# Patient Record
Sex: Female | Born: 1990 | Race: Black or African American | Hispanic: No | Marital: Single | Smoking: Never smoker
Health system: Southern US, Community
[De-identification: ages and names within clinical notes are randomized; demographics above are authoritative.]

## PROBLEM LIST (undated history)

## (undated) ENCOUNTER — Inpatient Hospital Stay: Payer: Self-pay

## (undated) DIAGNOSIS — E7800 Pure hypercholesterolemia, unspecified: Secondary | ICD-10-CM

## (undated) DIAGNOSIS — R87629 Unspecified abnormal cytological findings in specimens from vagina: Secondary | ICD-10-CM

## (undated) DIAGNOSIS — A749 Chlamydial infection, unspecified: Secondary | ICD-10-CM

## (undated) DIAGNOSIS — M21619 Bunion of unspecified foot: Secondary | ICD-10-CM

## (undated) HISTORY — PX: FOOT SURGERY: SHX648

## (undated) HISTORY — DX: Pure hypercholesterolemia, unspecified: E78.00

---
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
## 2009-04-11 ENCOUNTER — Emergency Department: Payer: Self-pay | Admitting: Emergency Medicine

## 2012-02-21 ENCOUNTER — Emergency Department: Payer: Self-pay | Admitting: *Deleted

## 2012-02-21 LAB — COMPREHENSIVE METABOLIC PANEL
Albumin: 4.1 g/dL (ref 3.4–5.0)
Alkaline Phosphatase: 79 U/L (ref 50–136)
Anion Gap: 9 (ref 7–16)
BUN: 13 mg/dL (ref 7–18)
Bilirubin,Total: 0.5 mg/dL (ref 0.2–1.0)
Calcium, Total: 8.9 mg/dL (ref 8.5–10.1)
Chloride: 105 mmol/L (ref 98–107)
Co2: 27 mmol/L (ref 21–32)
Creatinine: 0.79 mg/dL (ref 0.60–1.30)
EGFR (African American): >60
EGFR (Non-African Amer.): >60
Glucose: 95 mg/dL (ref 65–99)
Osmolality: 281 (ref 275–301)
Potassium: 3.4 mmol/L — ABNORMAL LOW (ref 3.5–5.1)
SGOT(AST): 15 U/L (ref 15–37)
SGPT (ALT): 14 U/L
Sodium: 141 mmol/L (ref 136–145)
Total Protein: 7.8 g/dL (ref 6.4–8.2)

## 2012-02-21 LAB — CBC
HCT: 40.9 % (ref 35.0–47.0)
HGB: 13.6 g/dL (ref 12.0–16.0)
MCH: 30.9 pg (ref 26.0–34.0)
MCHC: 33.3 g/dL (ref 32.0–36.0)
MCV: 93 fL (ref 80–100)
Platelet: 222 10*3/uL (ref 150–440)
RBC: 4.42 10*6/uL (ref 3.80–5.20)
RDW: 13 % (ref 11.5–14.5)
WBC: 8.6 10*3/uL (ref 3.6–11.0)

## 2012-02-21 LAB — URINALYSIS, COMPLETE
Bacteria: NONE SEEN
Bilirubin,UR: NEGATIVE
Blood: NEGATIVE
Glucose,UR: NEGATIVE mg/dL (ref 0–75)
Leukocyte Esterase: NEGATIVE
Nitrite: NEGATIVE
Ph: 5 (ref 4.5–8.0)
Protein: NEGATIVE
RBC,UR: 1 /HPF (ref 0–5)
Specific Gravity: 1.032 (ref 1.003–1.030)
Squamous Epithelial: 3
WBC UR: 1 /HPF (ref 0–5)

## 2012-02-21 LAB — WET PREP, GENITAL

## 2012-02-21 LAB — HCG, QUANTITATIVE, PREGNANCY: Beta Hcg, Quant.: 1368 m[IU]/mL — ABNORMAL HIGH

## 2012-02-27 ENCOUNTER — Other Ambulatory Visit: Payer: Self-pay | Admitting: Obstetrics and Gynecology

## 2012-02-27 LAB — HCG, QUANTITATIVE, PREGNANCY: Beta Hcg, Quant.: 7925 m[IU]/mL — ABNORMAL HIGH

## 2012-04-26 ENCOUNTER — Emergency Department: Payer: Self-pay | Admitting: Emergency Medicine

## 2012-04-26 LAB — LIPASE, BLOOD: Lipase: 177 U/L (ref 73–393)

## 2012-04-26 LAB — CBC
HCT: 40.3 % (ref 35.0–47.0)
HGB: 14 g/dL (ref 12.0–16.0)
MCH: 31.7 pg (ref 26.0–34.0)
MCHC: 34.8 g/dL (ref 32.0–36.0)
MCV: 91 fL (ref 80–100)
Platelet: 212 10*3/uL (ref 150–440)
RBC: 4.43 10*6/uL (ref 3.80–5.20)
RDW: 13.1 % (ref 11.5–14.5)
WBC: 11.7 10*3/uL — ABNORMAL HIGH (ref 3.6–11.0)

## 2012-04-26 LAB — URINALYSIS, COMPLETE
Bilirubin,UR: NEGATIVE
Blood: NEGATIVE
Glucose,UR: NEGATIVE mg/dL (ref 0–75)
Ketone: NEGATIVE
Leukocyte Esterase: NEGATIVE
Nitrite: NEGATIVE
Ph: 5 (ref 4.5–8.0)
Protein: NEGATIVE
RBC,UR: 2 /HPF (ref 0–5)
Specific Gravity: 1.029 (ref 1.003–1.030)
Squamous Epithelial: 15
WBC UR: 4 /HPF (ref 0–5)

## 2012-04-26 LAB — COMPREHENSIVE METABOLIC PANEL
Albumin: 3.7 g/dL (ref 3.4–5.0)
Alkaline Phosphatase: 60 U/L (ref 50–136)
Anion Gap: 9 (ref 7–16)
BUN: 14 mg/dL (ref 7–18)
Bilirubin,Total: 0.3 mg/dL (ref 0.2–1.0)
Calcium, Total: 9.2 mg/dL (ref 8.5–10.1)
Chloride: 105 mmol/L (ref 98–107)
Co2: 25 mmol/L (ref 21–32)
Creatinine: 0.69 mg/dL (ref 0.60–1.30)
EGFR (African American): >60
EGFR (Non-African Amer.): >60
Glucose: 70 mg/dL (ref 65–99)
Osmolality: 276 (ref 275–301)
Potassium: 3.7 mmol/L (ref 3.5–5.1)
SGOT(AST): 17 U/L (ref 15–37)
SGPT (ALT): 20 U/L (ref 12–78)
Sodium: 139 mmol/L (ref 136–145)
Total Protein: 7.9 g/dL (ref 6.4–8.2)

## 2012-04-26 LAB — HCG, QUANTITATIVE, PREGNANCY: Beta Hcg, Quant.: 97249 m[IU]/mL — ABNORMAL HIGH

## 2012-05-01 ENCOUNTER — Encounter: Payer: Self-pay | Admitting: Obstetrics and Gynecology

## 2012-05-12 ENCOUNTER — Encounter: Payer: Self-pay | Admitting: Obstetrics and Gynecology

## 2012-05-22 ENCOUNTER — Encounter: Payer: Self-pay | Admitting: Maternal and Fetal Medicine

## 2012-06-24 ENCOUNTER — Encounter: Payer: Self-pay | Admitting: Pediatric Cardiology

## 2012-07-17 ENCOUNTER — Encounter: Payer: Self-pay | Admitting: Obstetrics & Gynecology

## 2012-08-21 ENCOUNTER — Encounter: Payer: Self-pay | Admitting: Maternal & Fetal Medicine

## 2012-10-19 ENCOUNTER — Inpatient Hospital Stay: Payer: Self-pay | Admitting: Obstetrics and Gynecology

## 2012-10-19 LAB — CBC WITH DIFFERENTIAL/PLATELET
Basophil #: 0 10*3/uL (ref 0.0–0.1)
Basophil %: 0.2 %
Eosinophil #: 0 10*3/uL (ref 0.0–0.7)
Eosinophil %: 0.4 %
HCT: 37.5 % (ref 35.0–47.0)
HGB: 12.8 g/dL (ref 12.0–16.0)
Lymphocyte #: 1.1 10*3/uL (ref 1.0–3.6)
Lymphocyte %: 10.2 %
MCH: 30.7 pg (ref 26.0–34.0)
MCHC: 34.1 g/dL (ref 32.0–36.0)
MCV: 90 fL (ref 80–100)
Monocyte #: 1.1 x10 3/mm — ABNORMAL HIGH (ref 0.2–0.9)
Monocyte %: 9.9 %
Neutrophil #: 8.7 10*3/uL — ABNORMAL HIGH (ref 1.4–6.5)
Neutrophil %: 79.3 %
Platelet: 181 10*3/uL (ref 150–440)
RBC: 4.16 10*6/uL (ref 3.80–5.20)
RDW: 13.6 % (ref 11.5–14.5)
WBC: 11 10*3/uL (ref 3.6–11.0)

## 2012-10-21 LAB — HEMATOCRIT: HCT: 34.2 % — ABNORMAL LOW (ref 35.0–47.0)

## 2013-02-27 ENCOUNTER — Emergency Department: Payer: Self-pay | Admitting: Emergency Medicine

## 2013-07-09 IMAGING — US US OB >= 14 WKS - NRPT
1 series · 14 of 28 positions shown · non-contrast
Comparison: none

[Series 1: us ob >= 14 wks - nrpt · 0.26mm/px · 14 of 70 slices shown]
[im 3/70]
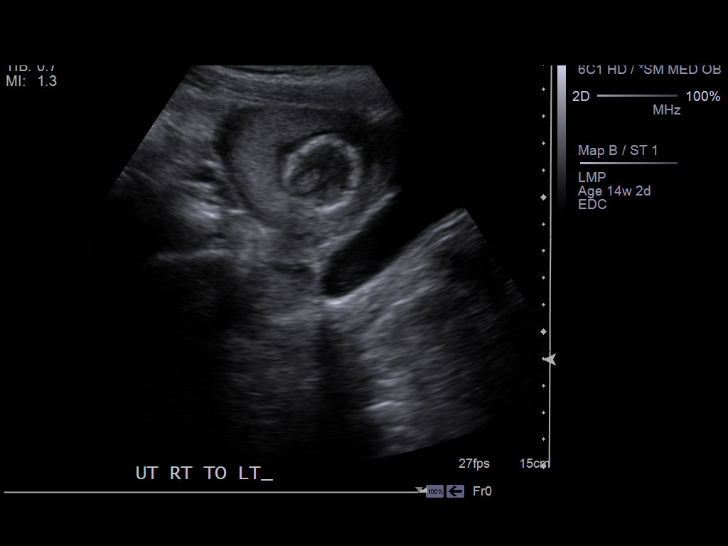
[im 8/70]
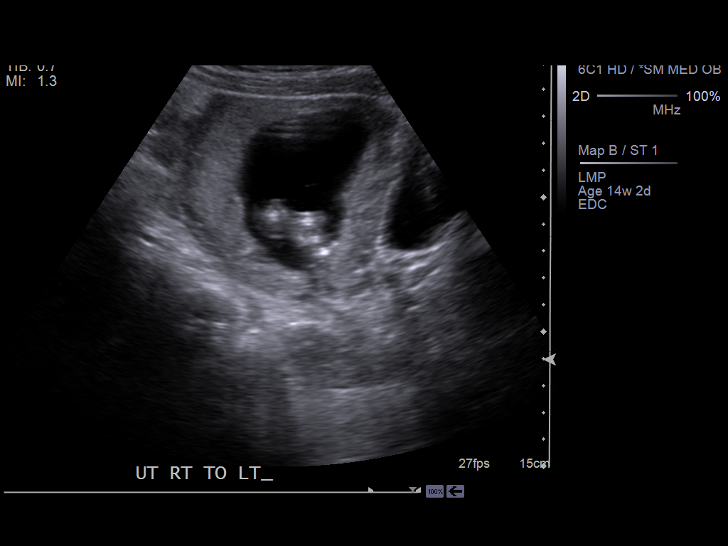
[im 13/70]
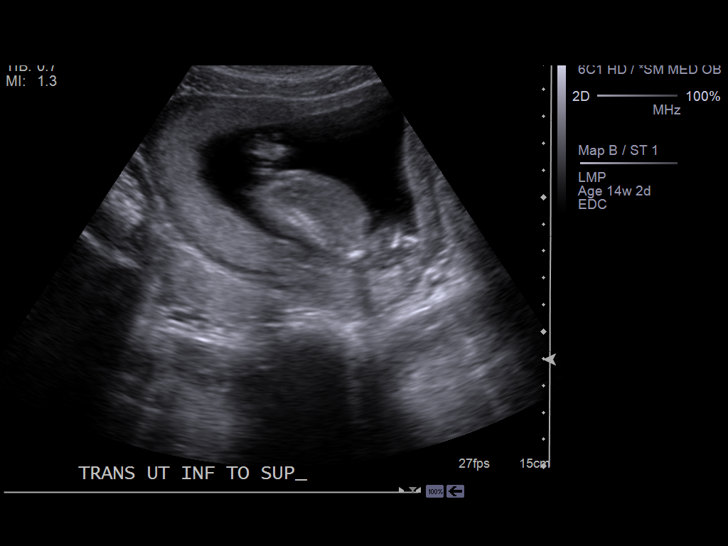
[im 18/70]
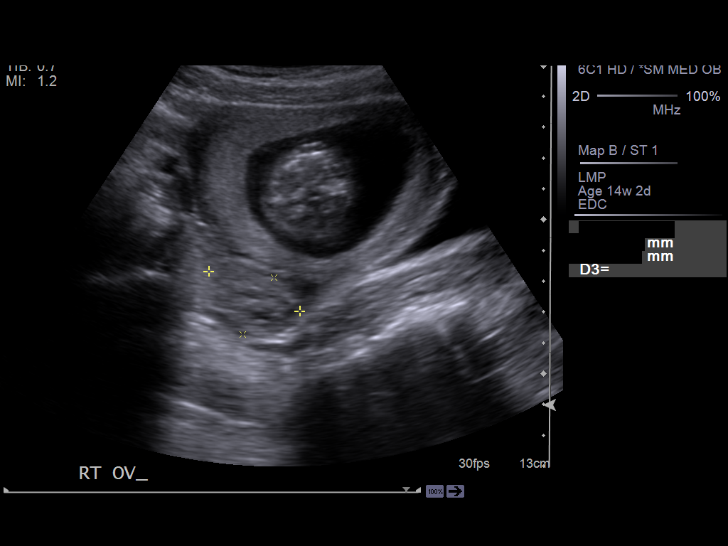
[im 24/70]
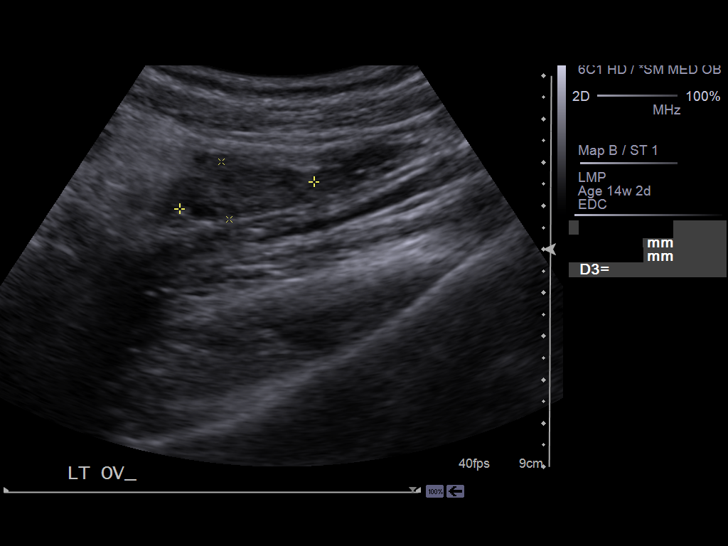
[im 29/70]
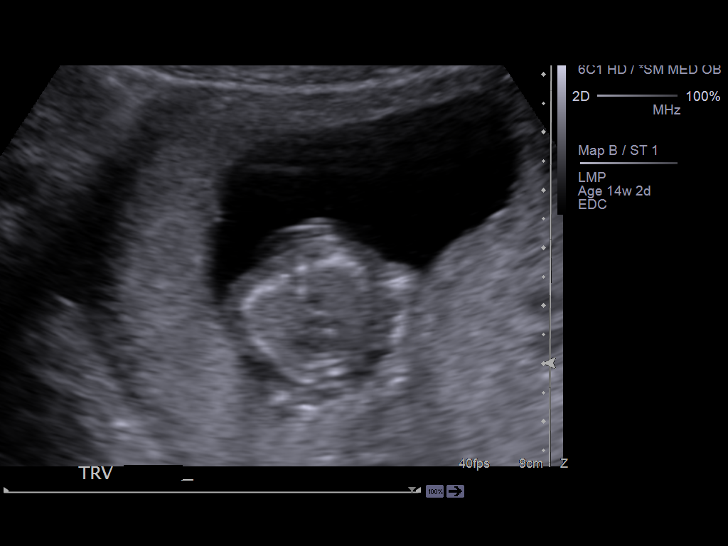
[im 34/70]
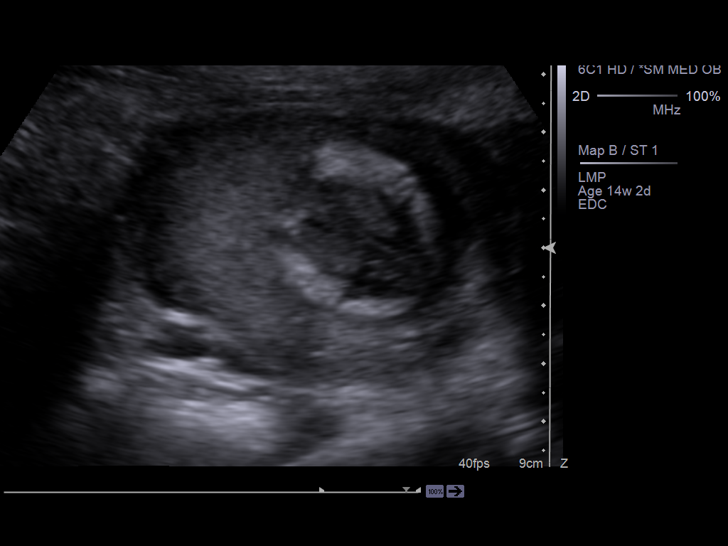
[im 39/70]
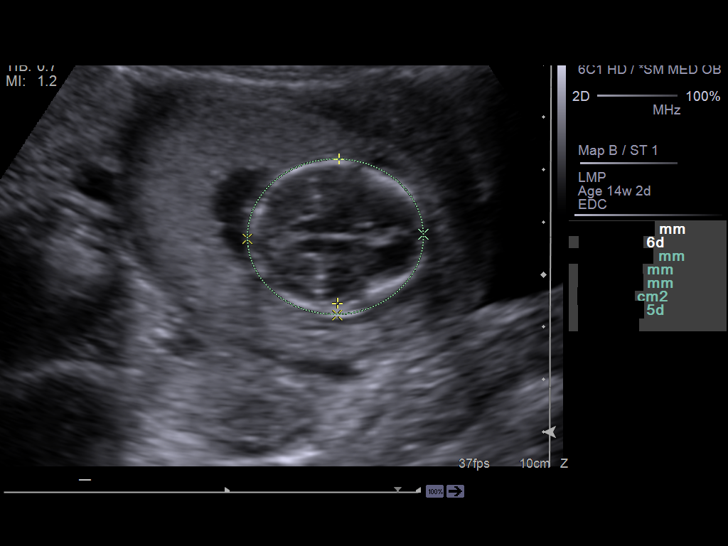
[im 44/70]
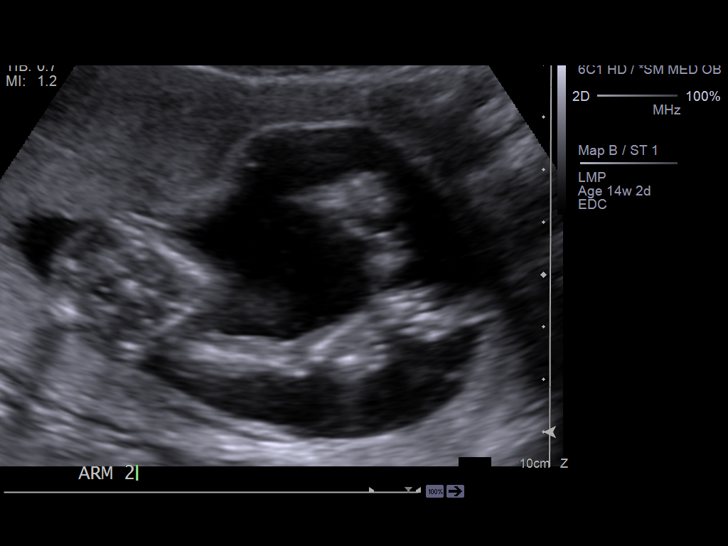
[im 49/70]
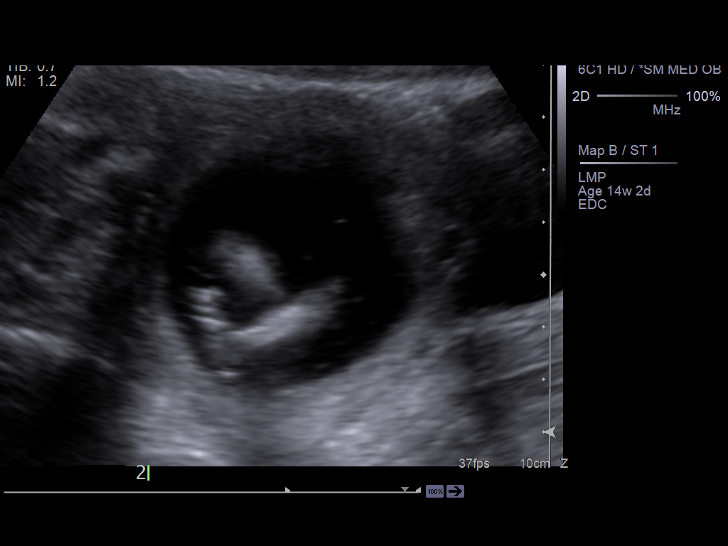
[im 54/70]
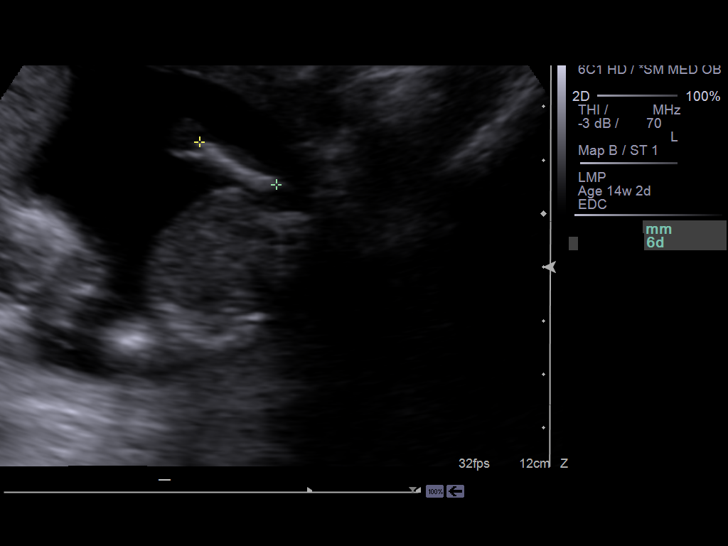
[im 59/70]
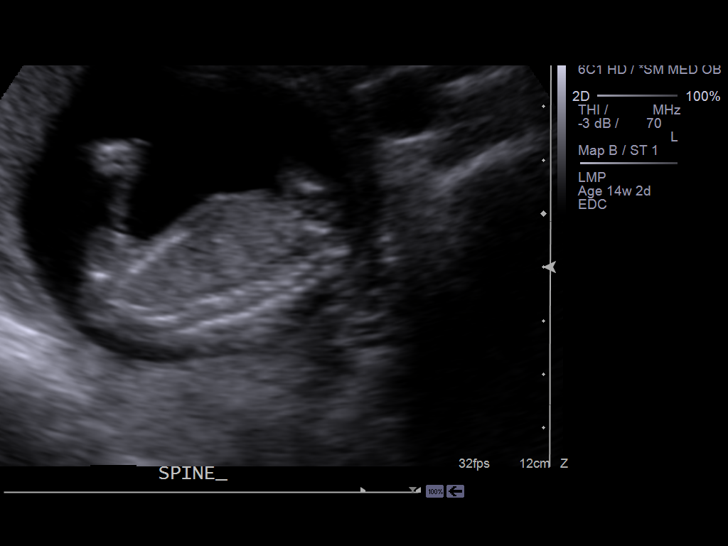
[im 64/70]
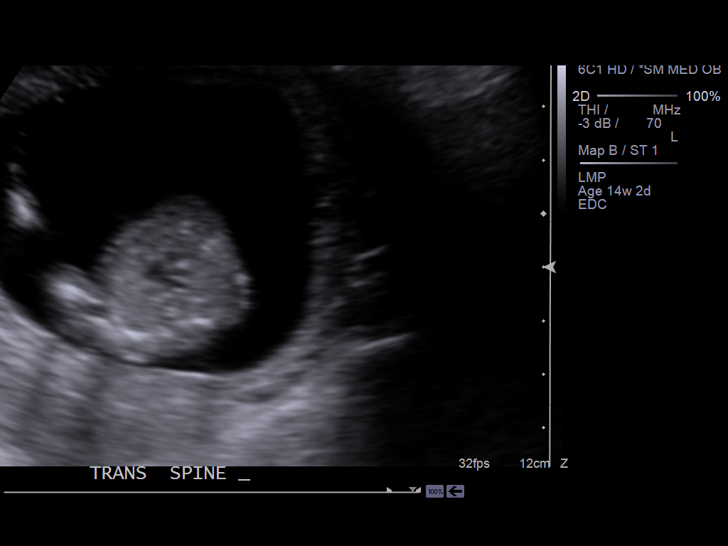
[im 70/70]
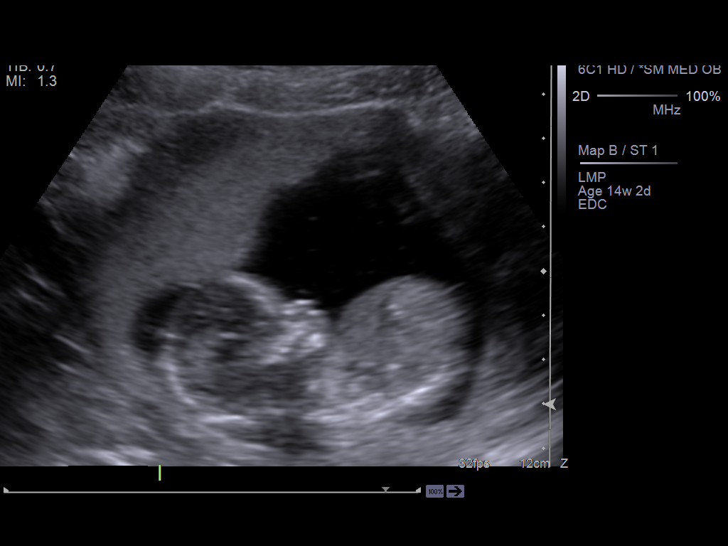

[14 of 28 positions shown; findings below may reference images not displayed]

IMAGES IMPORTED FROM THE [employer] WORKFLOW SYSTEM
NO DICTATION FOR STUDY

## 2013-09-21 ENCOUNTER — Emergency Department: Payer: Self-pay | Admitting: Emergency Medicine

## 2013-12-17 IMAGING — US US OB FOLLOW-UP - NRPT [facility]
1 series · 14 of 28 positions shown · non-contrast
Comparison: none

[Series 1: us ob follow-up - nrpt [facility] · 0.28mm/px · 14 of 33 slices shown]
[im 2/33]
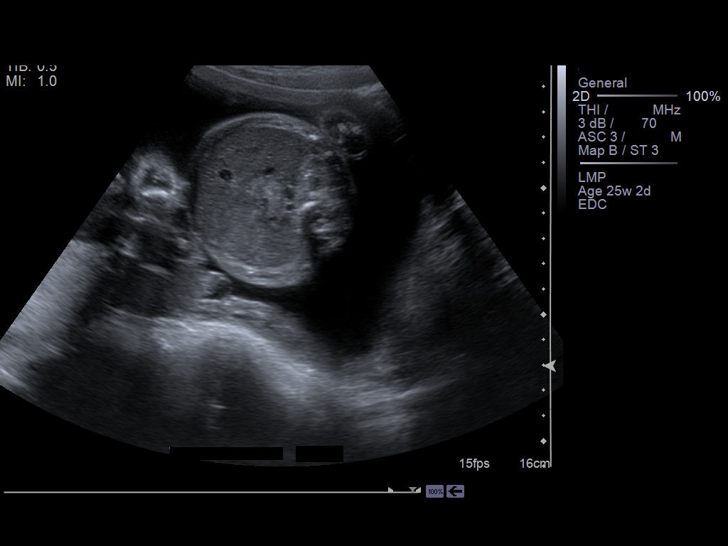
[im 4/33]
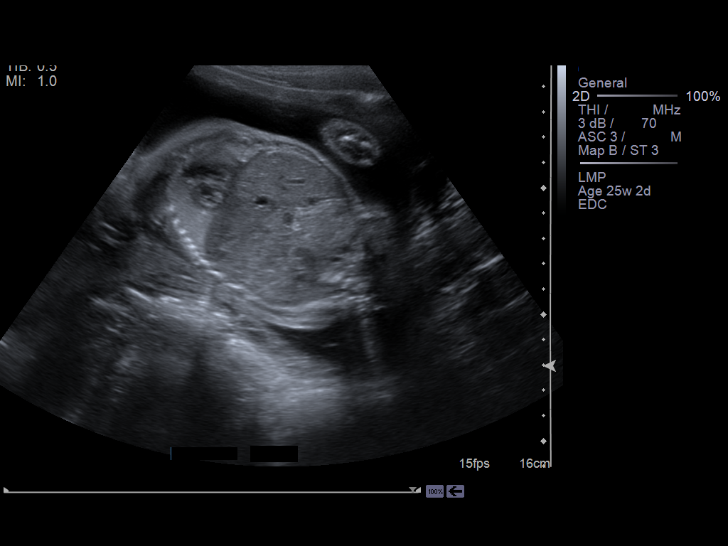
[im 6/33]
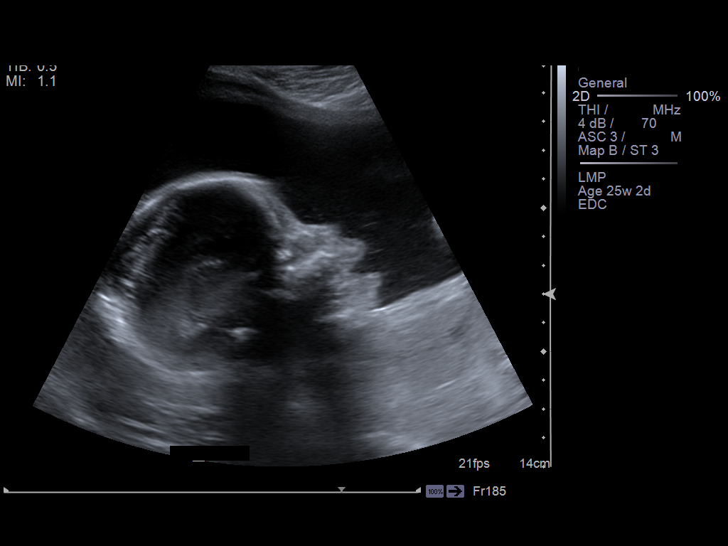
[im 9/33]
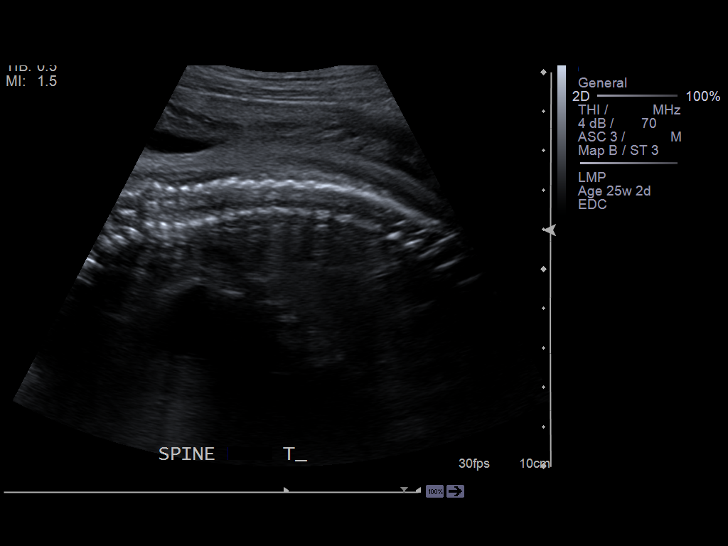
[im 11/33]
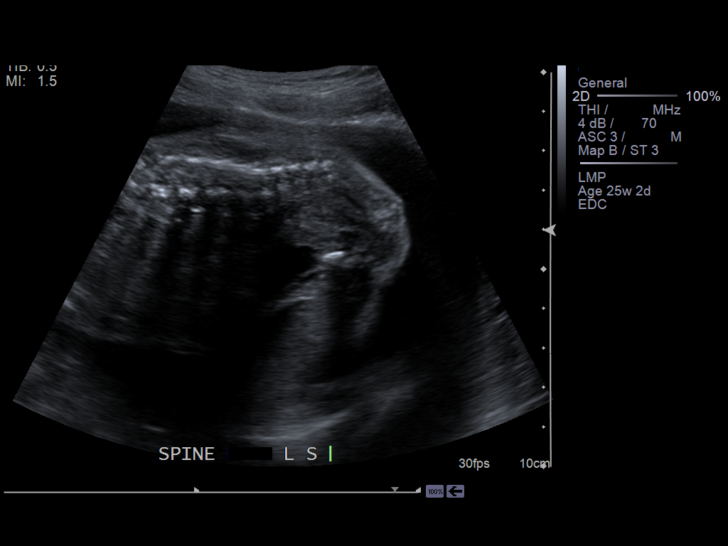
[im 14/33]
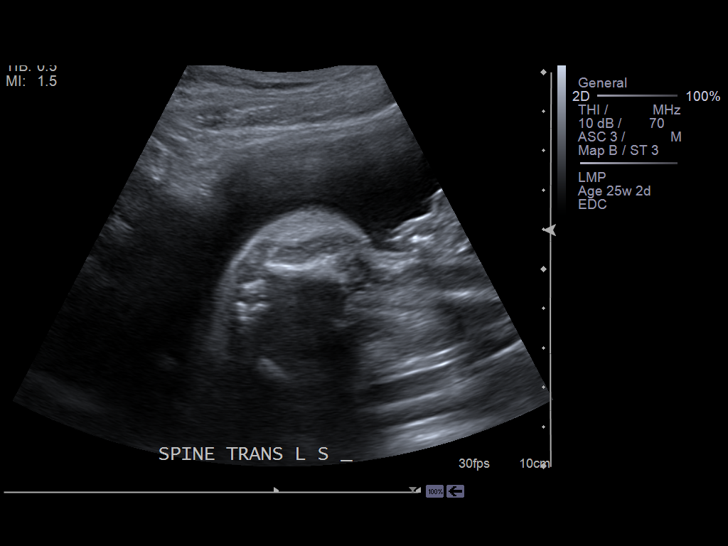
[im 16/33]
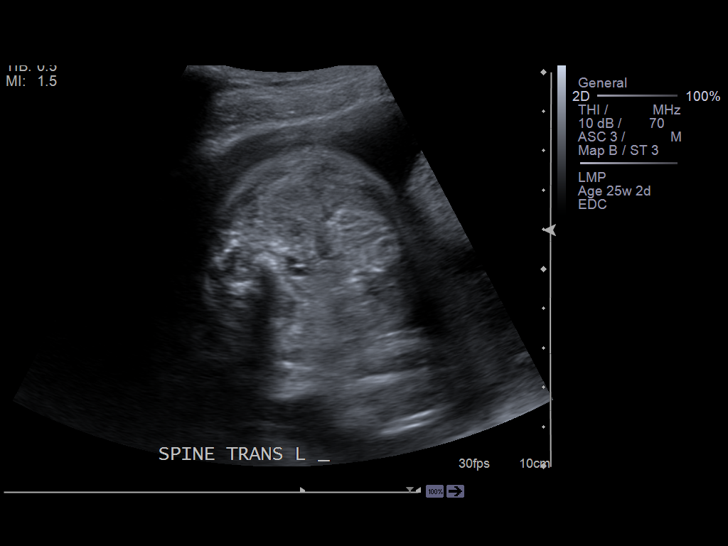
[im 18/33]
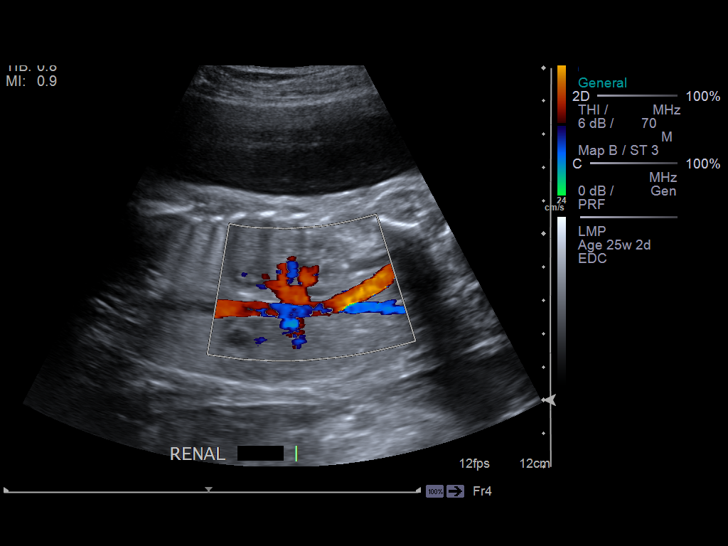
[im 21/33]
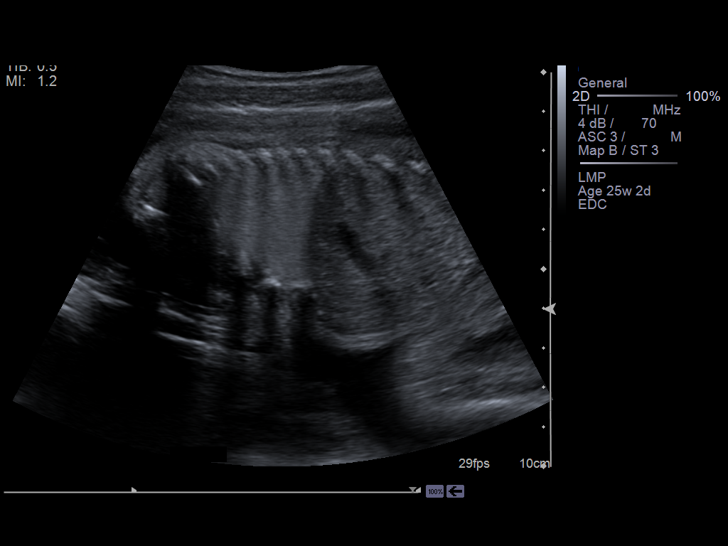
[im 23/33]
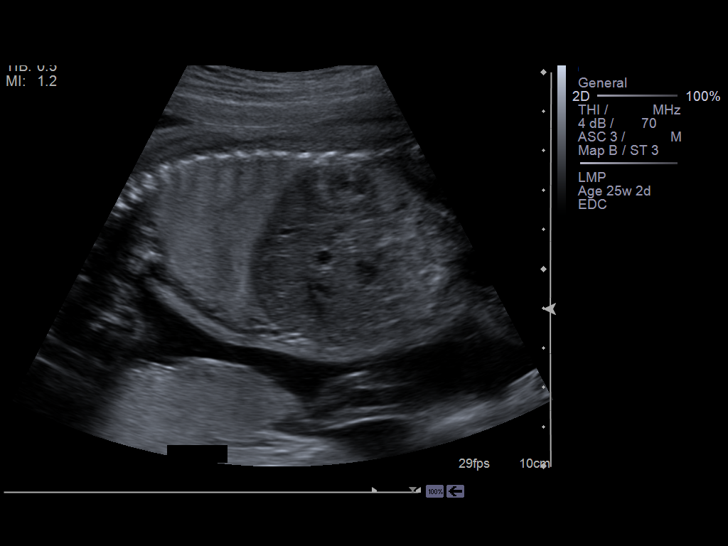
[im 25/33]
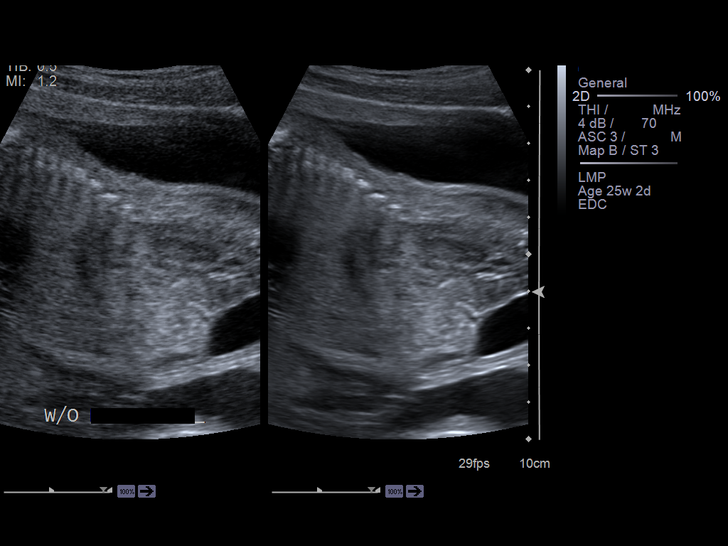
[im 28/33]
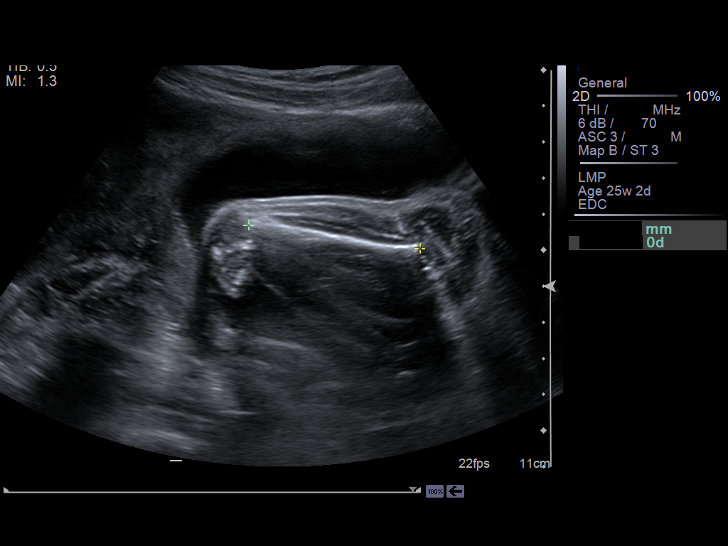
[im 30/33]
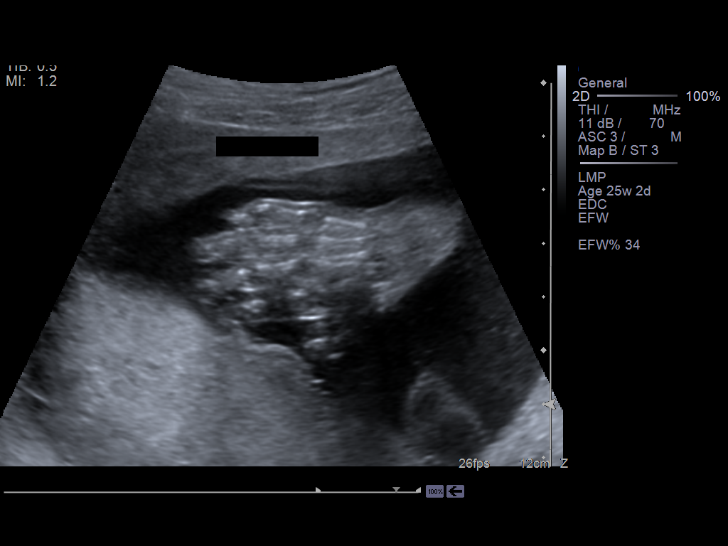
[im 33/33]
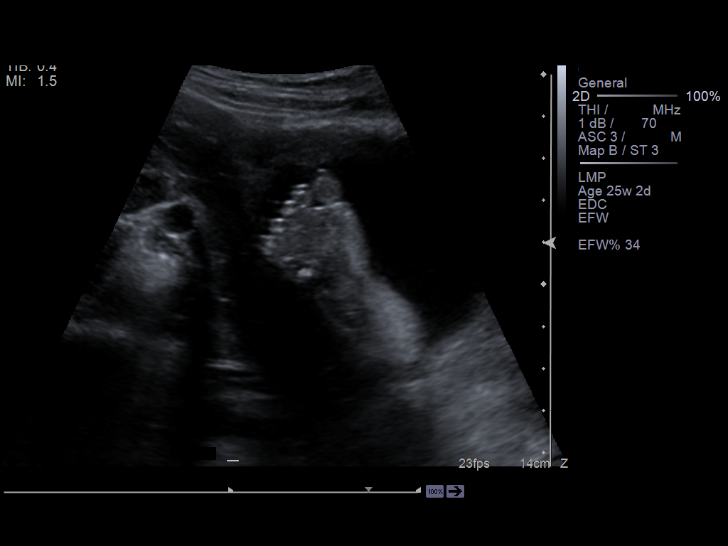

[14 of 28 positions shown; findings below may reference images not displayed]

IMAGES IMPORTED FROM THE SYNGO WORKFLOW SYSTEM
NO DICTATION FOR STUDY

## 2014-05-29 ENCOUNTER — Emergency Department: Payer: Self-pay | Admitting: Emergency Medicine

## 2014-05-29 LAB — COMPREHENSIVE METABOLIC PANEL
Albumin: 3.9 g/dL (ref 3.4–5.0)
Alkaline Phosphatase: 75 U/L
Anion Gap: 9 (ref 7–16)
BUN: 14 mg/dL (ref 7–18)
Bilirubin,Total: 0.3 mg/dL (ref 0.2–1.0)
Calcium, Total: 8.8 mg/dL (ref 8.5–10.1)
Chloride: 103 mmol/L (ref 98–107)
Co2: 28 mmol/L (ref 21–32)
Creatinine: 0.73 mg/dL (ref 0.60–1.30)
EGFR (African American): >60
EGFR (Non-African Amer.): >60
Glucose: 92 mg/dL (ref 65–99)
Osmolality: 280 (ref 275–301)
Potassium: 3.4 mmol/L — ABNORMAL LOW (ref 3.5–5.1)
SGOT(AST): 17 U/L (ref 15–37)
SGPT (ALT): 12 U/L — ABNORMAL LOW
Sodium: 140 mmol/L (ref 136–145)
Total Protein: 8 g/dL (ref 6.4–8.2)

## 2014-05-29 LAB — URINALYSIS, COMPLETE
Bilirubin,UR: NEGATIVE
Blood: NEGATIVE
Glucose,UR: NEGATIVE mg/dL (ref 0–75)
Ketone: NEGATIVE
Nitrite: NEGATIVE
Ph: 5 (ref 4.5–8.0)
Protein: NEGATIVE
RBC,UR: 1 /HPF (ref 0–5)
Specific Gravity: 1.028 (ref 1.003–1.030)
Squamous Epithelial: 2
WBC UR: 6 /HPF (ref 0–5)

## 2014-05-29 LAB — CBC WITH DIFFERENTIAL/PLATELET
Basophil #: 0 10*3/uL (ref 0.0–0.1)
Basophil %: 0.1 %
Eosinophil #: 0 10*3/uL (ref 0.0–0.7)
Eosinophil %: 0.2 %
HCT: 39.9 % (ref 35.0–47.0)
HGB: 13.4 g/dL (ref 12.0–16.0)
Lymphocyte #: 1.8 10*3/uL (ref 1.0–3.6)
Lymphocyte %: 17 %
MCH: 30.3 pg (ref 26.0–34.0)
MCHC: 33.7 g/dL (ref 32.0–36.0)
MCV: 90 fL (ref 80–100)
Monocyte #: 0.9 x10 3/mm (ref 0.2–0.9)
Monocyte %: 8.6 %
Neutrophil #: 7.9 10*3/uL — ABNORMAL HIGH (ref 1.4–6.5)
Neutrophil %: 74.1 %
Platelet: 227 10*3/uL (ref 150–440)
RBC: 4.43 10*6/uL (ref 3.80–5.20)
RDW: 13.2 % (ref 11.5–14.5)
WBC: 10.6 10*3/uL (ref 3.6–11.0)

## 2014-05-30 LAB — HCG, QUANTITATIVE, PREGNANCY: Beta Hcg, Quant.: 110205 m[IU]/mL — ABNORMAL HIGH

## 2014-05-30 LAB — WET PREP, GENITAL

## 2014-05-30 LAB — GC/CHLAMYDIA PROBE AMP

## 2014-06-30 LAB — OB RESULTS CONSOLE ABO/RH: RH Type: POSITIVE

## 2014-06-30 LAB — OB RESULTS CONSOLE RPR: RPR: NONREACTIVE

## 2014-06-30 LAB — OB RESULTS CONSOLE HIV ANTIBODY (ROUTINE TESTING): HIV: NONREACTIVE

## 2014-06-30 LAB — OB RESULTS CONSOLE HEPATITIS B SURFACE ANTIGEN: Hepatitis B Surface Ag: NEGATIVE

## 2014-06-30 LAB — OB RESULTS CONSOLE ANTIBODY SCREEN: Antibody Screen: NEGATIVE

## 2014-06-30 LAB — OB RESULTS CONSOLE RUBELLA ANTIBODY, IGM: Rubella: IMMUNE

## 2014-06-30 LAB — OB RESULTS CONSOLE VARICELLA ZOSTER ANTIBODY, IGG: Varicella: NON-IMMUNE/NOT IMMUNE

## 2014-07-05 ENCOUNTER — Encounter: Payer: Self-pay | Admitting: Maternal & Fetal Medicine

## 2014-07-19 ENCOUNTER — Emergency Department: Payer: Self-pay | Admitting: Emergency Medicine

## 2014-07-19 LAB — BASIC METABOLIC PANEL
Anion Gap: 9 (ref 7–16)
BUN: 13 mg/dL (ref 7–18)
Calcium, Total: 9 mg/dL (ref 8.5–10.1)
Chloride: 102 mmol/L (ref 98–107)
Co2: 25 mmol/L (ref 21–32)
Creatinine: 0.64 mg/dL (ref 0.60–1.30)
EGFR (African American): >60
EGFR (Non-African Amer.): >60
Glucose: 75 mg/dL (ref 65–99)
Osmolality: 271 (ref 275–301)
Potassium: 3.6 mmol/L (ref 3.5–5.1)
Sodium: 136 mmol/L (ref 136–145)

## 2014-07-19 LAB — URINALYSIS, COMPLETE
Bilirubin,UR: NEGATIVE
Blood: NEGATIVE
Glucose,UR: NEGATIVE mg/dL (ref 0–75)
Nitrite: NEGATIVE
Ph: 5 (ref 4.5–8.0)
Protein: NEGATIVE
RBC,UR: 2 /HPF (ref 0–5)
Specific Gravity: 1.025 (ref 1.003–1.030)
Squamous Epithelial: 3
WBC UR: 3 /HPF (ref 0–5)

## 2014-07-19 LAB — CBC WITH DIFFERENTIAL/PLATELET
Basophil #: 0.1 10*3/uL (ref 0.0–0.1)
Basophil %: 0.5 %
Eosinophil #: 0 10*3/uL (ref 0.0–0.7)
Eosinophil %: 0.2 %
HCT: 37.9 % (ref 35.0–47.0)
HGB: 12.5 g/dL (ref 12.0–16.0)
Lymphocyte #: 1.3 10*3/uL (ref 1.0–3.6)
Lymphocyte %: 9.6 %
MCH: 30.2 pg (ref 26.0–34.0)
MCHC: 33 g/dL (ref 32.0–36.0)
MCV: 91 fL (ref 80–100)
Monocyte #: 1 x10 3/mm — ABNORMAL HIGH (ref 0.2–0.9)
Monocyte %: 7.2 %
Neutrophil #: 11 10*3/uL — ABNORMAL HIGH (ref 1.4–6.5)
Neutrophil %: 82.5 %
Platelet: 204 10*3/uL (ref 150–440)
RBC: 4.15 10*6/uL (ref 3.80–5.20)
RDW: 13.5 % (ref 11.5–14.5)
WBC: 13.3 10*3/uL — ABNORMAL HIGH (ref 3.6–11.0)

## 2014-07-26 ENCOUNTER — Observation Stay: Payer: Self-pay | Admitting: Obstetrics and Gynecology

## 2014-07-26 LAB — MAGNESIUM: Magnesium: 1.7 mg/dL — ABNORMAL LOW

## 2014-07-27 LAB — COMPREHENSIVE METABOLIC PANEL
Albumin: 3 g/dL — ABNORMAL LOW (ref 3.4–5.0)
Alkaline Phosphatase: 59 U/L
Anion Gap: 8 (ref 7–16)
BUN: 8 mg/dL (ref 7–18)
Bilirubin,Total: 0.2 mg/dL (ref 0.2–1.0)
Calcium, Total: 8.9 mg/dL (ref 8.5–10.1)
Chloride: 104 mmol/L (ref 98–107)
Co2: 25 mmol/L (ref 21–32)
Creatinine: 0.69 mg/dL (ref 0.60–1.30)
EGFR (African American): >60
EGFR (Non-African Amer.): >60
Glucose: 84 mg/dL (ref 65–99)
Osmolality: 271 (ref 275–301)
Potassium: 3.8 mmol/L (ref 3.5–5.1)
SGOT(AST): 6 U/L — ABNORMAL LOW (ref 15–37)
SGPT (ALT): 12 U/L — ABNORMAL LOW
Sodium: 137 mmol/L (ref 136–145)
Total Protein: 6.9 g/dL (ref 6.4–8.2)

## 2014-07-27 LAB — CBC WITH DIFFERENTIAL/PLATELET
Basophil #: 0 10*3/uL (ref 0.0–0.1)
Basophil %: 0.3 %
Eosinophil #: 0.1 10*3/uL (ref 0.0–0.7)
Eosinophil %: 0.6 %
HCT: 35.9 % (ref 35.0–47.0)
HGB: 12.1 g/dL (ref 12.0–16.0)
Lymphocyte #: 1.9 10*3/uL (ref 1.0–3.6)
Lymphocyte %: 16.3 %
MCH: 30.8 pg (ref 26.0–34.0)
MCHC: 33.7 g/dL (ref 32.0–36.0)
MCV: 91 fL (ref 80–100)
Monocyte #: 1.1 x10 3/mm — ABNORMAL HIGH (ref 0.2–0.9)
Monocyte %: 9.3 %
Neutrophil #: 8.5 10*3/uL — ABNORMAL HIGH (ref 1.4–6.5)
Neutrophil %: 73.5 %
Platelet: 200 10*3/uL (ref 150–440)
RBC: 3.94 10*6/uL (ref 3.80–5.20)
RDW: 13.7 % (ref 11.5–14.5)
WBC: 11.6 10*3/uL — ABNORMAL HIGH (ref 3.6–11.0)

## 2014-08-13 NOTE — L&D Delivery Note (Signed)
Delivery Note At 12:35 PM a viable female was delivered via Vaginal, Spontaneous Delivery (Presentation:vertex).  APGAR: 7, 8; weight 6 lb 6.8 oz (2914 g).   Placenta status: Intact, Spontaneous.  Cord: 3 vessels with the following complications: None.  Cord pH: N/A  Anesthesia: Epidural  Episiotomy: None Lacerations: Periurethral Suture Repair: N/A Est. Blood Loss (mL): 300  Quick second stage. Infant to maternal abdomen, cord cut by family member and infant to bedside warmer by nursery staff for assessment.    Baby's Name: undecided  Mom to postpartum.  Baby to Couplet care / Skin to Skin.  Traci Petersen, Sache Sane 01/06/2015, 1:04 PM

## 2014-10-06 ENCOUNTER — Emergency Department: Payer: Self-pay | Admitting: Emergency Medicine

## 2014-12-21 ENCOUNTER — Observation Stay
Admission: EM | Admit: 2014-12-21 | Discharge: 2014-12-21 | Disposition: A | Discharge status: Home / Self Care | Payer: No Typology Code available for payment source | Attending: Obstetrics and Gynecology | Admitting: Obstetrics and Gynecology

## 2014-12-21 ENCOUNTER — Encounter: Payer: Self-pay | Admitting: *Deleted

## 2014-12-21 DIAGNOSIS — O4292 Full-term premature rupture of membranes, unspecified as to length of time between rupture and onset of labor: Principal | ICD-10-CM | POA: Insufficient documentation

## 2014-12-21 DIAGNOSIS — Z3A36 36 weeks gestation of pregnancy: Secondary | ICD-10-CM | POA: Diagnosis not present

## 2014-12-21 NOTE — Discharge Summary (Addendum)
OB Triage Discharge Summary   24 y.o. G2P1001 @ 1543w6d presenting for EOL.  Cervix clo/long/high; pt was 1/10 in clinic today at 1630. EFM showed rNST with irregular UCs Labs pending: none RTC 1 week  Triage evaluation done remotely.   Cornelia Copaharlie Everly Rubalcava, Jr MD [facility]  Pager: [phone]

## 2014-12-21 NOTE — Progress Notes (Signed)
Patient d/c ambulatory to home.  Patient left before we were able to give her discharge instruction papers,  However, I had verbally gone over s/s of labor.  S/s fetal wellbeing.  Stay hydrated and things to look for after having a SVE.  Patient verbalizes u/o same.

## 2014-12-21 NOTE — OB Triage Note (Signed)
Patient reports that contractions started at 0200 today. Contracting "on & off throughout the day. Now stronger. Unsure if AROM, states that she thought she was leaking at last OB visit. EFM applied, urine in bathroom. Loyola MastKaren R Maris Bena, RN

## 2014-12-24 LAB — OB RESULTS CONSOLE GC/CHLAMYDIA
Chlamydia: NEGATIVE
Gonorrhea: NEGATIVE

## 2014-12-24 LAB — OB RESULTS CONSOLE GBS: GBS: NEGATIVE

## 2015-01-06 ENCOUNTER — Inpatient Hospital Stay: Payer: No Typology Code available for payment source | Admitting: Anesthesiology

## 2015-01-06 ENCOUNTER — Inpatient Hospital Stay
Admission: EM | Admit: 2015-01-06 | Discharge: 2015-01-08 | DRG: 775 | Disposition: A | Discharge status: Home / Self Care | Payer: No Typology Code available for payment source | Attending: Obstetrics and Gynecology | Admitting: Obstetrics and Gynecology

## 2015-01-06 DIAGNOSIS — O4292 Full-term premature rupture of membranes, unspecified as to length of time between rupture and onset of labor: Secondary | ICD-10-CM | POA: Diagnosis present

## 2015-01-06 DIAGNOSIS — Z3A39 39 weeks gestation of pregnancy: Secondary | ICD-10-CM | POA: Diagnosis present

## 2015-01-06 DIAGNOSIS — O4690 Antepartum hemorrhage, unspecified, unspecified trimester: Secondary | ICD-10-CM | POA: Diagnosis present

## 2015-01-06 HISTORY — DX: Unspecified abnormal cytological findings in specimens from vagina: R87.629

## 2015-01-06 HISTORY — DX: Bunion of unspecified foot: M21.619

## 2015-01-06 HISTORY — DX: Chlamydial infection, unspecified: A74.9

## 2015-01-06 LAB — CBC
HCT: 38.1 % (ref 35.0–47.0)
Hemoglobin: 12.6 g/dL (ref 12.0–16.0)
MCH: 29.6 pg (ref 26.0–34.0)
MCHC: 33 g/dL (ref 32.0–36.0)
MCV: 89.7 fL (ref 80.0–100.0)
Platelets: 179 10*3/uL (ref 150–440)
RBC: 4.25 MIL/uL (ref 3.80–5.20)
RDW: 13.8 % (ref 11.5–14.5)
WBC: 11.8 10*3/uL — ABNORMAL HIGH (ref 3.6–11.0)

## 2015-01-06 LAB — TYPE AND SCREEN
ABO/RH(D): B POS
Antibody Screen: NEGATIVE

## 2015-01-06 LAB — CHLAMYDIA/NGC RT PCR (ARMC ONLY)
Chlamydia Tr: NOT DETECTED
N gonorrhoeae: DETECTED

## 2015-01-06 LAB — ABO/RH: ABO/RH(D): B POS

## 2015-01-06 MED ORDER — MISOPROSTOL 200 MCG PO TABS
ORAL_TABLET | ORAL | Status: AC
  Filled 2015-01-06: qty 4

## 2015-01-06 MED ORDER — BUTORPHANOL TARTRATE 1 MG/ML IJ SOLN
1.0000 mg | INTRAMUSCULAR | Status: DC | PRN
  Administered 2015-01-06: 1 mg via INTRAVENOUS

## 2015-01-06 MED ORDER — ONDANSETRON HCL 4 MG PO TABS
4.0000 mg | ORAL_TABLET | ORAL | Status: DC | PRN
  Filled 2015-01-06: qty 1

## 2015-01-06 MED ORDER — FENTANYL 2.5 MCG/ML W/ROPIVACAINE 0.2% IN NS 100 ML EPIDURAL INFUSION (ARMC-ANES)
EPIDURAL | Status: AC
  Administered 2015-01-06: 9 mL/h via EPIDURAL
  Filled 2015-01-06: qty 100

## 2015-01-06 MED ORDER — OXYTOCIN BOLUS FROM INFUSION: 500.0000 mL | INTRAVENOUS | Status: DC

## 2015-01-06 MED ORDER — OXYTOCIN 10 UNIT/ML IJ SOLN
INTRAMUSCULAR | Status: AC
  Filled 2015-01-06: qty 2

## 2015-01-06 MED ORDER — LACTATED RINGERS IV SOLN: INTRAVENOUS | Status: DC

## 2015-01-06 MED ORDER — ONDANSETRON HCL 4 MG/2ML IJ SOLN: 4.0000 mg | Freq: Four times a day (QID) | INTRAMUSCULAR | Status: DC | PRN

## 2015-01-06 MED ORDER — BUPIVACAINE HCL (PF) 0.25 % IJ SOLN
INTRAMUSCULAR | Status: DC | PRN
  Administered 2015-01-06 (×2): 4 mL

## 2015-01-06 MED ORDER — LIDOCAINE HCL (PF) 1 % IJ SOLN
INTRAMUSCULAR | Status: AC
  Filled 2015-01-06: qty 30

## 2015-01-06 MED ORDER — DIPHENHYDRAMINE HCL 50 MG/ML IJ SOLN: 12.5000 mg | INTRAMUSCULAR | Status: DC | PRN

## 2015-01-06 MED ORDER — LACTATED RINGERS IV SOLN: 500.0000 mL | INTRAVENOUS | Status: DC | PRN

## 2015-01-06 MED ORDER — PRENATAL MULTIVITAMIN CH
1.0000 | ORAL_TABLET | Freq: Every day | ORAL | Status: DC
  Administered 2015-01-07 – 2015-01-08 (×2): 1 via ORAL
  Filled 2015-01-06 (×2): qty 1

## 2015-01-06 MED ORDER — BUTORPHANOL TARTRATE 1 MG/ML IJ SOLN
INTRAMUSCULAR | Status: AC
  Administered 2015-01-06: 1 mg via INTRAVENOUS
  Filled 2015-01-06: qty 1

## 2015-01-06 MED ORDER — DIPHENHYDRAMINE HCL 25 MG PO CAPS: 25.0000 mg | ORAL_CAPSULE | Freq: Four times a day (QID) | ORAL | Status: DC | PRN

## 2015-01-06 MED ORDER — DIBUCAINE 1 % RE OINT: 1.0000 "application " | TOPICAL_OINTMENT | RECTAL | Status: DC | PRN

## 2015-01-06 MED ORDER — WITCH HAZEL-GLYCERIN EX PADS: 1.0000 "application " | MEDICATED_PAD | CUTANEOUS | Status: DC | PRN

## 2015-01-06 MED ORDER — PRENATAL PLUS 27-1 MG PO TABS: 1.0000 | ORAL_TABLET | Freq: Every day | ORAL | Status: DC

## 2015-01-06 MED ORDER — IBUPROFEN 600 MG PO TABS
600.0000 mg | ORAL_TABLET | Freq: Four times a day (QID) | ORAL | Status: DC
  Administered 2015-01-06 – 2015-01-08 (×8): 600 mg via ORAL
  Filled 2015-01-06 (×8): qty 1

## 2015-01-06 MED ORDER — LACTATED RINGERS IV SOLN
INTRAVENOUS | Status: DC
  Administered 2015-01-06 (×2): via INTRAVENOUS

## 2015-01-06 MED ORDER — LIDOCAINE HCL (PF) 1 % IJ SOLN: 30.0000 mL | INTRAMUSCULAR | Status: DC | PRN

## 2015-01-06 MED ORDER — ZOLPIDEM TARTRATE 5 MG PO TABS: 5.0000 mg | ORAL_TABLET | Freq: Every evening | ORAL | Status: DC | PRN

## 2015-01-06 MED ORDER — LANOLIN HYDROUS EX OINT: TOPICAL_OINTMENT | CUTANEOUS | Status: DC | PRN

## 2015-01-06 MED ORDER — ACETAMINOPHEN 325 MG PO TABS: 650.0000 mg | ORAL_TABLET | ORAL | Status: DC | PRN

## 2015-01-06 MED ORDER — SIMETHICONE 80 MG PO CHEW
80.0000 mg | CHEWABLE_TABLET | ORAL | Status: DC | PRN
  Filled 2015-01-06: qty 1

## 2015-01-06 MED ORDER — FENTANYL 2.5 MCG/ML W/ROPIVACAINE 0.2% IN NS 100 ML EPIDURAL INFUSION (ARMC-ANES)
9.0000 mL/h | EPIDURAL | Status: DC
  Administered 2015-01-06 (×2): 9 mL/h via EPIDURAL

## 2015-01-06 MED ORDER — BENZOCAINE-MENTHOL 20-0.5 % EX AERO: 1.0000 "application " | INHALATION_SPRAY | CUTANEOUS | Status: DC | PRN

## 2015-01-06 MED ORDER — AMMONIA AROMATIC IN INHA
RESPIRATORY_TRACT | Status: AC
  Filled 2015-01-06: qty 10

## 2015-01-06 MED ORDER — LIDOCAINE-EPINEPHRINE (PF) 1.5 %-1:200000 IJ SOLN
INTRAMUSCULAR | Status: DC | PRN
  Administered 2015-01-06: 1.5 mL

## 2015-01-06 MED ORDER — HYDROCODONE-ACETAMINOPHEN 5-325 MG PO TABS
1.0000 | ORAL_TABLET | Freq: Four times a day (QID) | ORAL | Status: DC | PRN
Start: 2015-01-06 — End: 2015-01-08

## 2015-01-06 MED ORDER — OXYTOCIN 40 UNITS IN LACTATED RINGERS INFUSION - SIMPLE MED
INTRAVENOUS | Status: AC
  Filled 2015-01-06: qty 1000

## 2015-01-06 MED ORDER — EPHEDRINE 5 MG/ML INJ: 10.0000 mg | INTRAVENOUS | Status: DC | PRN

## 2015-01-06 MED ORDER — PHENYLEPHRINE 40 MCG/ML (10ML) SYRINGE FOR IV PUSH (FOR BLOOD PRESSURE SUPPORT): 80.0000 ug | PREFILLED_SYRINGE | INTRAVENOUS | Status: DC | PRN

## 2015-01-06 MED ORDER — OXYTOCIN 40 UNITS IN LACTATED RINGERS INFUSION - SIMPLE MED: 62.5000 mL/h | INTRAVENOUS | Status: DC

## 2015-01-06 MED ORDER — ONDANSETRON HCL 4 MG/2ML IJ SOLN: 4.0000 mg | INTRAMUSCULAR | Status: DC | PRN

## 2015-01-06 MED ORDER — FERROUS SULFATE 325 (65 FE) MG PO TABS
325.0000 mg | ORAL_TABLET | Freq: Every day | ORAL | Status: DC
  Administered 2015-01-07 – 2015-01-08 (×2): 325 mg via ORAL
  Filled 2015-01-06 (×2): qty 1

## 2015-01-06 MED ORDER — DOCUSATE SODIUM 100 MG PO CAPS: 100.0000 mg | ORAL_CAPSULE | Freq: Two times a day (BID) | ORAL | Status: DC | PRN

## 2015-01-06 NOTE — H&P (Signed)
Obstetric History and Physical  Traci Petersen is a 24 y.o. G2P1001 with IUP at 2125w1d based on a 7573w1d scan presenting for some blood discharge when she wipes. She reports that she woke up mat around 3 am and she noticed the bleeding when she went to the bathroom along with some contractions. She also reports ? SROM around 0530 am.  Patient states she has been having  regular, every 2-6 minutes contractions, minimal vaginal bleeding, possible membranes, with active fetal movement.    Prenatal Course Source of Care: [facility]  with onset of care at 12  weeks Pregnancy complications or risks:  GC infection during pregnancy- seen by [facility] with no f/u needed, CMV exposure with negative IgG antibody, and negative informaseq for a history of G1 with Down syndrome.  Patient Active Problem List   Diagnosis Date Noted  . Vaginal bleeding in pregnancy 01/06/2015  . Labor and delivery, indication for care 12/21/2014   She plans to breastfeed   Prenatal labs and studies: ABO, Rh: B/Positive/-- (11/18 0000) Antibody: Negative (11/18 0000) Rubella: Immune (11/18 0000) Varicella: non-immune RPR: Nonreactive (11/18 0000)  HBsAg: Negative (11/18 0000)  HIV: Non-reactive (11/18 0000)  WUJ:WJXBJYNWGBS:Negative (05/13 0000) 1 hr Glucola  116 Genetic screening normal Anatomy US normal Tdap: given  Flu: NA   Past Medical History  Diagnosis Date  . Vaginal Pap smear, abnormal   . Chlamydia   . Bunion     Past Surgical History  Procedure Laterality Date  . Foot surgery      OB History  Gravida Para Term Preterm AB SAB TAB Ectopic Multiple Living  2 1 1       1     # Outcome Date GA Lbr Len/2nd Weight Sex Delivery Anes PTL Lv  2 Current           1 Term 10/20/12   2.863 kg (6 lb 5 oz) M Vag-Spont   Y      History   Social History  . Marital Status: Single    Spouse Name: N/A  . Number of Children: N/A  . Years of Education: N/A   Social History Main Topics  . Smoking status: Never  Smoker   . Smokeless tobacco: Never Used  . Alcohol Use: No  . Drug Use: No  . Sexual Activity: Yes    Birth Control/ Protection: None   Other Topics Concern  . None   Social History Narrative    Family History  Problem Relation Age of Onset  . Breast cancer Paternal Grandmother     Prescriptions prior to admission  Medication Sig Dispense Refill Last Dose  . prenatal vitamin w/FE, FA (PRENATAL 1 + 1) 27-1 MG TABS tablet Take 1 tablet by mouth daily at 12 noon.   01/05/2015 at Unknown time    No Known Allergies  Review of Systems: Negative except for what is mentioned in HPI.  Physical Exam: BP 113/66 mmHg  Pulse 71  Temp(Src) 98.5 F (36.9 C) (Oral)  Resp 16  Ht 5\' 6"  (1.676 m)  Wt 77.111 kg (170 lb)  BMI 27.45 kg/m2 GENERAL: Well-developed, well-nourished female in no acute distress.  LUNGS: Clear to auscultation bilaterally.  HEART: Regular rate and rhythm. ABDOMEN: Soft, nontender, nondistended, gravid. EXTREMITIES: Nontender, no edema, 2+ distal pulses. Dilation: 3 Effacement (%): 80 Cervical Position: Anterior Station: -1 Presentation: Vertex Exam by:: JLW  Grossly ruptured confirmed with SSE- +pooling and +fern Presentation: cephalic FHT:  Baseline rate 135 bpm  Variability moderate  Accelerations present   Decelerations none Contractions: Every 4-6 mins  Pertinent Labs/Studies:   No results found for this or any previous visit (from the past 24 hour(s)).  Assessment : Traci Petersen is a 24 y.o. G2P1001 at [redacted]w[redacted]d being admitted for labor.  Plan: Labor: Expectant management.  Induction/Augmentation as needed, per protocol FWB: Reassuring fetal heart tracing.   GBS negative Delivery plan: Hopeful for vaginal delivery Plans breastfeeding   Jannet Mantis, CNM [facility]

## 2015-01-06 NOTE — Anesthesia Procedure Notes (Signed)
Epidural Patient location during procedure: OB Start time: 01/06/2015 11:31 AM End time: 01/06/2015 11:43 AM  Staffing Resident/CRNA: Stormy FabianURTIS, Nat Lowenthal  Preanesthetic Checklist Completed: patient identified, site marked, surgical consent, pre-op evaluation, timeout performed, IV checked, risks and benefits discussed and monitors and equipment checked  Epidural Patient position: sitting Prep: Betadine Patient monitoring: heart rate, continuous pulse ox and blood pressure Approach: midline Location: L4-L5 Injection technique: LOR saline  Needle:  Needle type: Tuohy  Needle gauge: 18 G Needle length: 9 cm and 9 Needle insertion depth: 6.5 cm Catheter type: closed end flexible Catheter size: 20 Guage Catheter at skin depth: 10 cm Test dose: negative and 1.5% lidocaine with Epi 1:200 K  Assessment Events: blood not aspirated, injection not painful, no injection resistance, negative IV test and no paresthesia  Additional Notes   Patient tolerated the insertion well without complications.Reason for block:procedure for pain

## 2015-01-06 NOTE — Progress Notes (Signed)
L&D Note  01/06/2015 - 9:23 AM  24 y.o. G2P1001 4449w1d   [name] is admitted for SROM and labor   Subjective:  Requesting pain medication  Objective:   Filed Vitals:   01/06/15 0528 01/06/15 0544 01/06/15 0713  BP: 113/66  109/70  Pulse: 71  90  Temp:  98.5 F (36.9 C) 98.1 F (36.7 C)  TempSrc:  Oral Oral  Resp:  16 18  Height: 5\' 6"  (1.676 m)    Weight: 77.111 kg (170 lb)      Current Vital Signs 24h Vital Sign Ranges  T 98.1 F (36.7 C) Temp  Avg: 98.3 F (36.8 C)  Min: 98.1 F (36.7 C)  Max: 98.5 F (36.9 C)  BP 109/70 mmHg BP  Min: 109/70  Max: 113/66  HR 90 Pulse  Avg: 80.5  Min: 71  Max: 90  RR 18 Resp  Avg: 17  Min: 16  Max: 18  SaO2   Not Delivered No Data Recorded       24 Hour I/O Current Shift I/O  Time Ins Outs        FHR: cat 1, baseline 130, mod variability, + accels, no decels Toco: q 2-4 min SVE: 6/80/-2   Assessment :  IUP at 7049w1d, labor     Plan:  IV stadol Epidural when labs returned  Marta AntuBrothers, Klayten Jolliff, [place]

## 2015-01-06 NOTE — Anesthesia Preprocedure Evaluation (Signed)
Anesthesia Evaluation  Patient identified by MRN, date of birth, ID band Patient awake    Reviewed: Allergy & Precautions, H&P , NPO status , Patient's Chart, lab work & pertinent test results  History of Anesthesia Complications Negative for: history of anesthetic complications  Airway Mallampati: II  TM Distance: >3 FB Neck ROM: full    Dental no notable dental hx.    Pulmonary neg pulmonary ROS,    Pulmonary exam normal       Cardiovascular negative cardio ROS Normal cardiovascular exam    Neuro/Psych negative neurological ROS  negative psych ROS   GI/Hepatic Neg liver ROS,   Endo/Other  negative endocrine ROS  Renal/GU   negative genitourinary   Musculoskeletal   Abdominal   Peds  Hematology negative hematology ROS (+)   Anesthesia Other Findings   Reproductive/Obstetrics (+) Pregnancy                             Anesthesia Physical Anesthesia Plan  ASA: II  Anesthesia Plan: Epidural   Post-op Pain Management:    Induction:   Airway Management Planned:   Additional Equipment:   Intra-op Plan:   Post-operative Plan:   Informed Consent: I have reviewed the patients History and Physical, chart, labs and discussed the procedure including the risks, benefits and alternatives for the proposed anesthesia with the patient or authorized representative who has indicated his/her understanding and acceptance.     Plan Discussed with: Anesthesiologist  Anesthesia Plan Comments:         Anesthesia Quick Evaluation

## 2015-01-06 NOTE — OB Triage Note (Signed)
Patient c/o small amount vaginal bleeding when wiping; contractions every 20 minutes

## 2015-01-06 NOTE — Plan of Care (Signed)
Problem: Consults Goal: Birthing Suites Patient Information Press F2 to bring up selections list  Outcome: Completed/Met Date Met:  01/06/15  Pt 37-[redacted] weeks EGA

## 2015-01-07 LAB — HEMATOCRIT: HCT: 35.5 % (ref 35.0–47.0)

## 2015-01-07 LAB — RPR: RPR Ser Ql: NONREACTIVE

## 2015-01-07 MED ORDER — CEFTRIAXONE SODIUM 250 MG IJ SOLR
250.0000 mg | Freq: Once | INTRAMUSCULAR | Status: AC
  Administered 2015-01-07: 250 mg via INTRAMUSCULAR
  Filled 2015-01-07: qty 250

## 2015-01-07 MED ORDER — AZITHROMYCIN 250 MG PO TABS
1000.0000 mg | ORAL_TABLET | Freq: Once | ORAL | Status: AC
  Administered 2015-01-07: 1000 mg via ORAL
  Filled 2015-01-07: qty 2

## 2015-01-07 NOTE — Anesthesia Postprocedure Evaluation (Signed)
  Anesthesia Post-op Note  Patient: Traci Petersen  Procedure(s) Performed: * No procedures listed *  Anesthesia type:Epidural  Patient location: 337-A  Post pain: Pain level controlled  Post assessment: Post-op Vital signs reviewed, Patient's Cardiovascular Status Stable, Respiratory Function Stable, Patent Airway and No signs of Nausea or vomiting  Post vital signs: Reviewed and stable  Last Vitals:  Filed Vitals:   01/07/15 0454  BP: 110/68  Pulse: 80  Temp: 36.8 C  Resp:     Level of consciousness: awake, alert  and patient cooperative  Complications: No apparent anesthesia complications

## 2015-01-07 NOTE — Progress Notes (Signed)
Post Partum Day  1 from a SVD Subjective: no complaints, up ad lib, voiding, tolerating PO and pt report ssome problems with the baby latching on.   Objective: Blood pressure 108/60, pulse 62, temperature 98.2 F (36.8 C), temperature source Oral, resp. rate 20, height 5\' 6"  (1.676 m), weight 77.111 kg (170 lb), SpO2 100 %, unknown if currently breastfeeding.  Physical Exam:  General: alert, cooperative, appears stated age and no distress Lochia: appropriate Uterine Fundus: firm Incision: NA DVT Evaluation: No evidence of DVT seen on physical exam.   Recent Labs  01/06/15 0939 01/07/15 0442  HGB 12.6  --   HCT 38.1 35.5    Assessment/Plan:  Plan for discharge tomorrow, Breastfeeding and Lactation consult  Pt plans [facility]planon- [facility] notified of a + GC culture- will treat pt with ABX. Will need a TOC at her 6 week visit.    LOS: 1 day   Jannet MantisSubudhi,  Twania Bujak 01/07/2015, 10:21 AM

## 2015-01-08 MED ORDER — IBUPROFEN 600 MG PO TABS: 600.0000 mg | ORAL_TABLET | Freq: Four times a day (QID) | ORAL | Status: DC

## 2015-01-08 MED ORDER — VARICELLA VIRUS VACCINE LIVE 1350 PFU/0.5ML IJ SUSR
0.5000 mL | Freq: Once | INTRAMUSCULAR | Status: AC
  Administered 2015-01-08: 0.5 mL via SUBCUTANEOUS
  Filled 2015-01-08: qty 0.5

## 2015-01-08 NOTE — Discharge Instructions (Signed)

## 2015-01-08 NOTE — Discharge Summary (Signed)
Obstetric Discharge Summary Reason for Admission: onset of labor Prenatal Procedures: none Intrapartum Procedures: spontaneous vaginal delivery Postpartum Procedures: none Complications-Operative and Postpartum: none HEMOGLOBIN  Date Value Ref Range Status  01/06/2015 12.6 12.0 - 16.0 g/dL Final   HGB  Date Value Ref Range Status  07/27/2014 12.1 12.0-16.0 g/dL Final   HCT  Date Value Ref Range Status  01/07/2015 35.5 35.0 - 47.0 % Final  07/27/2014 35.9 35.0-47.0 % Final    Physical Exam:  General: alert and cooperative Lochia: appropriate Uterine Fundus: firm DVT Evaluation: No evidence of DVT seen on physical exam.  Discharge Diagnoses: Term Pregnancy-delivered  Discharge Information: Date: 01/08/2015 Activity: unrestricted Diet: routine Medications: PNV and Ibuprofen Condition: stable Instructions: refer to practice specific booklet Discharge to: home Follow-up Information    Follow up with Marta AntuBrothers, Tamara, CNM. Schedule an appointment as soon as possible for a visit in 2 weeks.   Specialty:  Certified Nurse Midwife   Why:  For Post Partum Check and signing/scheduling [facility]    Contact information:   [address] [place] [address] [phone]       Newborn Data: Live born female  Birth Weight: 6 lb 6.8 oz (2914 g) APGAR: 7, 8  Home with mother.  HARRIS,ROBERT PAUL 01/08/2015, 1:21 PM

## 2015-01-08 NOTE — Progress Notes (Addendum)
Patient discharged home with infant. Vital signs stable, bleeding within normal limits, uterus firm. Discharge instructions, prescriptions, and follow up appointment given to and reviewed with patient. Patient verbalized understanding, all questions answered. Escorted in wheelchair by nursing.    

## 2015-07-21 ENCOUNTER — Emergency Department
Admission: EM | Admit: 2015-07-21 | Discharge: 2015-07-21 | Disposition: A | Discharge status: Home / Self Care | Payer: No Typology Code available for payment source | Attending: Emergency Medicine | Admitting: Emergency Medicine

## 2015-07-21 DIAGNOSIS — Y92410 Unspecified street and highway as the place of occurrence of the external cause: Secondary | ICD-10-CM | POA: Insufficient documentation

## 2015-07-21 DIAGNOSIS — Y998 Other external cause status: Secondary | ICD-10-CM | POA: Diagnosis not present

## 2015-07-21 DIAGNOSIS — Y9389 Activity, other specified: Secondary | ICD-10-CM | POA: Diagnosis not present

## 2015-07-21 DIAGNOSIS — H578 Other specified disorders of eye and adnexa: Secondary | ICD-10-CM | POA: Insufficient documentation

## 2015-07-21 DIAGNOSIS — H5789 Other specified disorders of eye and adnexa: Secondary | ICD-10-CM

## 2015-07-21 DIAGNOSIS — S3992XA Unspecified injury of lower back, initial encounter: Secondary | ICD-10-CM | POA: Diagnosis present

## 2015-07-21 DIAGNOSIS — S39012A Strain of muscle, fascia and tendon of lower back, initial encounter: Secondary | ICD-10-CM | POA: Diagnosis not present

## 2015-07-21 DIAGNOSIS — Z79899 Other long term (current) drug therapy: Secondary | ICD-10-CM | POA: Insufficient documentation

## 2015-07-21 MED ORDER — MELOXICAM 15 MG PO TABS: 15.0000 mg | ORAL_TABLET | Freq: Every day | ORAL | Status: DC

## 2015-07-21 MED ORDER — NAPHAZOLINE HCL 0.1 % OP SOLN
2.0000 [drp] | Freq: Once | OPHTHALMIC | Status: AC
  Administered 2015-07-21: 2 [drp] via OPHTHALMIC
  Filled 2015-07-21: qty 15

## 2015-07-21 MED ORDER — CYCLOBENZAPRINE HCL 10 MG PO TABS: 10.0000 mg | ORAL_TABLET | Freq: Three times a day (TID) | ORAL | Status: DC | PRN
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
## 2015-07-21 NOTE — Discharge Instructions (Signed)

## 2016-01-06 ENCOUNTER — Emergency Department
Admission: EM | Admit: 2016-01-06 | Discharge: 2016-01-06 | Disposition: A | Discharge status: Home / Self Care | Payer: No Typology Code available for payment source | Attending: Emergency Medicine | Admitting: Emergency Medicine

## 2016-01-06 ENCOUNTER — Encounter: Payer: Self-pay | Admitting: Emergency Medicine

## 2016-01-06 ENCOUNTER — Emergency Department: Payer: No Typology Code available for payment source

## 2016-01-06 DIAGNOSIS — M25532 Pain in left wrist: Secondary | ICD-10-CM | POA: Diagnosis not present

## 2016-01-06 DIAGNOSIS — Y92410 Unspecified street and highway as the place of occurrence of the external cause: Secondary | ICD-10-CM | POA: Diagnosis not present

## 2016-01-06 DIAGNOSIS — M79632 Pain in left forearm: Secondary | ICD-10-CM | POA: Insufficient documentation

## 2016-01-06 DIAGNOSIS — Y999 Unspecified external cause status: Secondary | ICD-10-CM | POA: Diagnosis not present

## 2016-01-06 DIAGNOSIS — M79602 Pain in left arm: Secondary | ICD-10-CM

## 2016-01-06 DIAGNOSIS — Y939 Activity, unspecified: Secondary | ICD-10-CM | POA: Diagnosis not present

## 2016-01-06 MED ORDER — CYCLOBENZAPRINE HCL 10 MG PO TABS: 10.0000 mg | ORAL_TABLET | Freq: Three times a day (TID) | ORAL | Status: DC | PRN

## 2016-01-06 MED ORDER — NAPROXEN 500 MG PO TABS: 500.0000 mg | ORAL_TABLET | Freq: Two times a day (BID) | ORAL | Status: AC
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
## 2016-01-06 NOTE — Discharge Instructions (Signed)
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
## 2017-11-22 LAB — HM PAP SMEAR: HM Pap smear: NEGATIVE

## 2018-08-29 LAB — HM HIV SCREENING LAB: HM HIV Screening: NEGATIVE

## 2019-02-09 ENCOUNTER — Other Ambulatory Visit: Payer: Self-pay

## 2019-02-09 ENCOUNTER — Ambulatory Visit (LOCAL_COMMUNITY_HEALTH_CENTER): Payer: BLUE CROSS/BLUE SHIELD

## 2019-02-09 VITALS — BP 120/79 | Ht 66.5 in | Wt 160.0 lb

## 2019-02-09 DIAGNOSIS — Z30013 Encounter for initial prescription of injectable contraceptive: Secondary | ICD-10-CM

## 2019-02-09 DIAGNOSIS — Z3042 Encounter for surveillance of injectable contraceptive: Secondary | ICD-10-CM

## 2019-02-09 DIAGNOSIS — Z3009 Encounter for other general counseling and advice on contraception: Secondary | ICD-10-CM

## 2019-02-09 MED ORDER — MEDROXYPROGESTERONE ACETATE 150 MG/ML IM SUSP
150.0000 mg | Freq: Once | INTRAMUSCULAR | Status: AC
  Administered 2019-02-09: 150 mg via INTRAMUSCULAR

## 2019-02-09 NOTE — Progress Notes (Signed)
Per Wilburn Mylar FNP, ok to give 1 time Depo 150mg  IM inj. Patient counseled that before next Depo she will have to see provider for RP/Prob. Original Depo order expired. Depo given; tolerated well.

## 2019-02-10 ENCOUNTER — Telehealth: Payer: Self-pay

## 2019-02-11 ENCOUNTER — Ambulatory Visit: Payer: BLUE CROSS/BLUE SHIELD

## 2019-02-11 ENCOUNTER — Other Ambulatory Visit: Payer: Self-pay

## 2019-02-11 DIAGNOSIS — A749 Chlamydial infection, unspecified: Secondary | ICD-10-CM

## 2019-02-11 MED ORDER — AZITHROMYCIN 500 MG PO TABS: 1000.0000 mg | ORAL_TABLET | Freq: Once | ORAL | 0 refills | Status: AC

## 2019-02-14 ENCOUNTER — Other Ambulatory Visit: Payer: Self-pay

## 2019-02-14 NOTE — Telephone Encounter (Signed)
TC with patient re: + chlamydia and need for tx. Verified ID via password.  Appt scheduled and instructed to eat before visit. Aileen Fass, RN

## 2019-02-14 NOTE — Progress Notes (Signed)
Patient tx'd for chlaymdia per SO. Aileen Fass, RN

## 2019-02-23 ENCOUNTER — Encounter: Payer: Self-pay | Admitting: Physician Assistant

## 2019-04-03 MED ORDER — AZITHROMYCIN 500 MG PO TABS
1000.0000 mg | ORAL_TABLET | Freq: Once | ORAL | Status: AC
  Administered 2019-02-11: 10:00:00 1000 mg via ORAL

## 2019-04-03 NOTE — Addendum Note (Signed)
Addended by: Aileen Fass on: 04/03/2019 09:57 AM   Modules accepted: Orders

## 2019-04-27 ENCOUNTER — Encounter: Payer: Self-pay | Admitting: Certified Nurse Midwife

## 2019-05-05 ENCOUNTER — Ambulatory Visit (INDEPENDENT_AMBULATORY_CARE_PROVIDER_SITE_OTHER): Payer: BLUE CROSS/BLUE SHIELD | Admitting: Certified Nurse Midwife

## 2019-05-05 ENCOUNTER — Inpatient Hospital Stay (HOSPITAL_COMMUNITY): Admit: 2019-05-05 | Payer: BLUE CROSS/BLUE SHIELD

## 2019-05-05 ENCOUNTER — Other Ambulatory Visit: Payer: Self-pay

## 2019-05-05 ENCOUNTER — Other Ambulatory Visit: Payer: Self-pay | Admitting: Certified Nurse Midwife

## 2019-05-05 ENCOUNTER — Encounter: Payer: Self-pay | Admitting: Certified Nurse Midwife

## 2019-05-05 VITALS — BP 115/74 | HR 95 | Ht 66.5 in | Wt 174.1 lb

## 2019-05-05 DIAGNOSIS — Z01419 Encounter for gynecological examination (general) (routine) without abnormal findings: Secondary | ICD-10-CM

## 2019-05-05 DIAGNOSIS — Z3009 Encounter for other general counseling and advice on contraception: Secondary | ICD-10-CM | POA: Diagnosis not present

## 2019-05-05 DIAGNOSIS — N898 Other specified noninflammatory disorders of vagina: Secondary | ICD-10-CM | POA: Diagnosis not present

## 2019-05-05 LAB — POCT URINE PREGNANCY: Preg Test, Ur: NEGATIVE

## 2019-05-05 MED ORDER — NORELGESTROMIN-ETH ESTRADIOL 150-35 MCG/24HR TD PTWK: 1.0000 | MEDICATED_PATCH | TRANSDERMAL | 12 refills | Status: DC

## 2019-05-05 NOTE — Patient Instructions (Signed)
Preventive Care 21-29 Years Old, Female Preventive care refers to visits with your health care provider and lifestyle choices that can promote health and wellness. This includes:  A yearly physical exam. This may also be called an annual well check.  Regular dental visits and eye exams.  Immunizations.  Screening for certain conditions.  Healthy lifestyle choices, such as eating a healthy diet, getting regular exercise, not using drugs or products that contain nicotine and tobacco, and limiting alcohol use. What can I expect for my preventive care visit? Physical exam Your health care provider will check your:  Height and weight. This may be used to calculate body mass index (BMI), which tells if you are at a healthy weight.  Heart rate and blood pressure.  Skin for abnormal spots. Counseling Your health care provider may ask you questions about your:  Alcohol, tobacco, and drug use.  Emotional well-being.  Home and relationship well-being.  Sexual activity.  Eating habits.  Work and work environment.  Method of birth control.  Menstrual cycle.  Pregnancy history. What immunizations do I need?  Influenza (flu) vaccine  This is recommended every year. Tetanus, diphtheria, and pertussis (Tdap) vaccine  You may need a Td booster every 10 years. Varicella (chickenpox) vaccine  You may need this if you have not been vaccinated. Human papillomavirus (HPV) vaccine  If recommended by your health care provider, you may need three doses over 6 months. Measles, mumps, and rubella (MMR) vaccine  You may need at least one dose of MMR. You may also need a second dose. Meningococcal conjugate (MenACWY) vaccine  One dose is recommended if you are age 19-21 years and a first-year college student living in a residence hall, or if you have one of several medical conditions. You may also need additional booster doses. Pneumococcal conjugate (PCV13) vaccine  You may need  this if you have certain conditions and were not previously vaccinated. Pneumococcal polysaccharide (PPSV23) vaccine  You may need one or two doses if you smoke cigarettes or if you have certain conditions. Hepatitis A vaccine  You may need this if you have certain conditions or if you travel or work in places where you may be exposed to hepatitis A. Hepatitis B vaccine  You may need this if you have certain conditions or if you travel or work in places where you may be exposed to hepatitis B. Haemophilus influenzae type b (Hib) vaccine  You may need this if you have certain conditions. You may receive vaccines as individual doses or as more than one vaccine together in one shot (combination vaccines). Talk with your health care provider about the risks and benefits of combination vaccines. What tests do I need?  Blood tests  Lipid and cholesterol levels. These may be checked every 5 years starting at age 20.  Hepatitis C test.  Hepatitis B test. Screening  Diabetes screening. This is done by checking your blood sugar (glucose) after you have not eaten for a while (fasting).  Sexually transmitted disease (STD) testing.  BRCA-related cancer screening. This may be done if you have a family history of breast, ovarian, tubal, or peritoneal cancers.  Pelvic exam and Pap test. This may be done every 3 years starting at age 21. Starting at age 30, this may be done every 5 years if you have a Pap test in combination with an HPV test. Talk with your health care provider about your test results, treatment options, and if necessary, the need for more tests.   Follow these instructions at home: Eating and drinking   Eat a diet that includes fresh fruits and vegetables, whole grains, lean protein, and low-fat dairy.  Take vitamin and mineral supplements as recommended by your health care provider.  Do not drink alcohol if: ? Your health care provider tells you not to drink. ? You are  pregnant, may be pregnant, or are planning to become pregnant.  If you drink alcohol: ? Limit how much you have to 0-1 drink a day. ? Be aware of how much alcohol is in your drink. In the [place] one drink equals one 12 oz bottle of beer (355 mL), one 5 oz glass of wine (148 mL), or one 1 oz glass of hard liquor (44 mL). Lifestyle  Take daily care of your teeth and gums.  Stay active. Exercise for at least 30 minutes on 5 or more days each week.  Do not use any products that contain nicotine or tobacco, such as cigarettes, e-cigarettes, and chewing tobacco. If you need help quitting, ask your health care provider.  If you are sexually active, practice safe sex. Use a condom or other form of birth control (contraception) in order to prevent pregnancy and STIs (sexually transmitted infections). If you plan to become pregnant, see your health care provider for a preconception visit. What's next?  Visit your health care provider once a year for a well check visit.  Ask your health care provider how often you should have your eyes and teeth checked.  Stay up to date on all vaccines. This information is not intended to replace advice given to you by your health care provider. Make sure you discuss any questions you have with your health care provider. Document Released: 09/25/2001 Document Revised: 04/10/2018 Document Reviewed: 04/10/2018 Elsevier Patient Education  2020 Elsevier Inc.  

## 2019-05-05 NOTE — Progress Notes (Signed)
GYNECOLOGY ANNUAL PREVENTATIVE CARE ENCOUNTER NOTE  History:     Traci Petersen is a 28 y.o. G54P2002 female here for a routine annual gynecologic exam.  Current complaints: none. She has have a 30 lbs weight gain in the past few months . Is having it worked up by PCP.  States she is prediabetic and has elevated cholesterol was started on Lipitor.  Denies abnormal vaginal bleeding, discharge, pelvic pain, problems with intercourse or other gynecologic concerns.    Gynecologic History No LMP recorded. Patient has had an injection. Contraception: none, was on depo injection . Would like to try something else  Last Pap: 11/22/2017. Results were: normal  Last mammogram: n/a .   Has 2 girls age 56 and 49 ( one with down syndrome).  Obstetric History OB History  Gravida Para Term Preterm AB Living  2 2 2     2   SAB TAB Ectopic Multiple Live Births        0 2    # Outcome Date GA Lbr Len/2nd Weight Sex Delivery Anes PTL Lv  2 Term 01/06/15 [redacted]w[redacted]d 06:37 / 00:20 6 lb 6.8 oz (2.914 kg) F Vag-Spont EPI  LIV  1 Term 10/20/12   6 lb 5 oz (2.863 kg) F Vag-Spont  N LIV    Past Medical History:  Diagnosis Date  . Bunion   . Chlamydia   . Vaginal Pap smear, abnormal     Past Surgical History:  Procedure Laterality Date  . FOOT SURGERY      Current Outpatient Medications on File Prior to Visit  Medication Sig Dispense Refill  . atorvastatin (LIPITOR) 40 MG tablet Take 40 mg by mouth daily.    . medroxyPROGESTERone (DEPO-PROVERA) 150 MG/ML injection Inject into the muscle.     No current facility-administered medications on file prior to visit.     No Known Allergies  Social History:  reports that she has never smoked. She has never used smokeless tobacco. She reports that she does not drink alcohol or use drugs.   Exercises 2-3 x wkly for 20 min.  Denies smoking, drugs, and drinking.   Family History  Problem Relation Age of Onset  . Breast cancer Paternal Grandmother   .  Hypertension Paternal Grandmother   . Heart disease Father   . Hypertension Father   . Heart murmur Daughter   . Down syndrome Daughter   . Alcohol abuse Maternal Grandfather   . Alcohol abuse Paternal Grandfather     The following portions of the patient's history were reviewed and updated as appropriate: allergies, current medications, past family history, past medical history, past social history, past surgical history and problem list.  Review of Systems Pertinent items noted in HPI and remainder of comprehensive ROS otherwise negative.  Physical Exam:  BP 115/74   Pulse 95   Ht 5' 6.5" (1.689 m)   Wt 174 lb 1 oz (79 kg)   BMI 27.67 kg/m  CONSTITUTIONAL: Well-developed, well-nourished over weight female in no acute distress.  HENT:  Normocephalic, atraumatic, External right and left ear normal. Oropharynx is clear and moist EYES: Conjunctivae and EOM are normal. Pupils are equal, round, and reactive to light. No scleral icterus.  NECK: Normal range of motion, supple, no masses.  Normal thyroid.  SKIN: Skin is warm and dry. No rash noted. Not diaphoretic. No erythema. No pallor. MUSCULOSKELETAL: Normal range of motion. No tenderness.  No cyanosis, clubbing, or edema.  2+ distal pulses. NEUROLOGIC: Alert and oriented  to person, place, and time. Normal reflexes, muscle tone coordination. No cranial nerve deficit noted. PSYCHIATRIC: Normal mood and affect. Normal behavior. Normal judgment and thought content. CARDIOVASCULAR: Normal heart rate noted, regular rhythm RESPIRATORY: Clear to auscultation bilaterally. Effort and breath sounds normal, no problems with respiration noted. BREASTS: Symmetric in size. No masses, skin changes, nipple drainage, or lymphadenopathy. ABDOMEN: Soft, normal bowel sounds, no distention noted.  No tenderness, rebound or guarding.  PELVIC: Normal appearing external genitalia; normal appearing vaginal mucosa and cervix.  No abnormal discharge noted.  Pap  smear not indicated .vaginal swab collected for increased discharge per pt.   Normal uterine size, no other palpable masses, no uterine or adnexal tenderness.   Assessment and Plan:  Annual Well Women GYN Exam  Pap smear not due until 2022 Mammogram n/a. Pamphlet given on BCRA gene, pt has hx breast cancer in her family.  Labs: none done at PCP  Orders placed for patches, reviewed use . She denies any contraindications.  Routine preventative health maintenance measures emphasized. Encouraged diet and exercise for weight loss.  Please refer to After Visit Summary for other counseling recommendations.     Doreene Burke, CNM

## 2019-05-07 ENCOUNTER — Other Ambulatory Visit: Payer: Self-pay | Admitting: Certified Nurse Midwife

## 2019-05-07 LAB — CERVICOVAGINAL ANCILLARY ONLY
Bacterial Vaginitis (gardnerella): POSITIVE — AB
Candida Glabrata: NEGATIVE
Candida Vaginitis: POSITIVE — AB
Molecular Disclaimer: NEGATIVE
Molecular Disclaimer: NEGATIVE
Molecular Disclaimer: NEGATIVE
Molecular Disclaimer: NORMAL
Trichomonas: NEGATIVE

## 2019-05-07 MED ORDER — FLUCONAZOLE 150 MG PO TABS: 150.0000 mg | ORAL_TABLET | Freq: Once | ORAL | 1 refills | Status: AC

## 2019-05-07 MED ORDER — METRONIDAZOLE 500 MG PO TABS: 500.0000 mg | ORAL_TABLET | Freq: Two times a day (BID) | ORAL | 0 refills | Status: AC

## 2019-05-07 NOTE — Progress Notes (Signed)
Pt vaginal swab positive for BV and yeast. Nurse to call and notified pt. Orders placed for treatment.   Philip Aspen, CNM

## 2019-05-08 ENCOUNTER — Telehealth: Payer: Self-pay

## 2019-05-08 ENCOUNTER — Other Ambulatory Visit: Payer: Self-pay

## 2019-05-08 ENCOUNTER — Other Ambulatory Visit: Payer: Self-pay | Admitting: Certified Nurse Midwife

## 2019-05-08 LAB — CERVICOVAGINAL ANCILLARY ONLY
Chlamydia: POSITIVE — AB
Neisseria Gonorrhea: NEGATIVE

## 2019-05-08 MED ORDER — AZITHROMYCIN 500 MG PO TABS: 1000.0000 mg | ORAL_TABLET | Freq: Once | ORAL | 0 refills | Status: DC

## 2019-05-08 MED ORDER — AZITHROMYCIN 500 MG PO TABS: 1000.0000 mg | ORAL_TABLET | Freq: Once | ORAL | 0 refills | Status: AC

## 2019-05-08 NOTE — Progress Notes (Signed)
Vaginal swab positive for Chlamydia, orders placed for treatment.   Philip Aspen, CNM

## 2019-05-08 NOTE — Telephone Encounter (Signed)
Patient informed of positive test results and ATs orders. Info also mailed. 

## 2019-05-08 NOTE — Telephone Encounter (Signed)
Patient informed of positive test results and ATs orders. Info also mailed.

## 2019-05-08 NOTE — Telephone Encounter (Signed)
Per [facility] diagnosis needs to be on script. It was checked off on the previous order. Order resent.

## 2019-05-08 NOTE — Telephone Encounter (Signed)
-----   Message from Philip Aspen, [place] sent at 05/08/2019 11:17 AM EDT ----- Ivin Booty,   Please let Shaquia know that her swab was also positive for Chlamydia. I have placed an order for treatment. Please tell her to make sure her partner is treated before have intercourse again other wise she will get it again. Please send her some information.   Thanks  Deneise Lever

## 2019-06-30 ENCOUNTER — Telehealth: Payer: Self-pay

## 2019-06-30 ENCOUNTER — Encounter: Payer: BLUE CROSS/BLUE SHIELD | Admitting: Certified Nurse Midwife

## 2019-06-30 NOTE — Telephone Encounter (Signed)
[facility] -  visit is followup on patch?

## 2019-07-01 ENCOUNTER — Ambulatory Visit: Payer: Self-pay | Admitting: Family Medicine

## 2019-07-01 ENCOUNTER — Encounter: Payer: Self-pay | Admitting: Family Medicine

## 2019-07-01 ENCOUNTER — Other Ambulatory Visit: Payer: Self-pay

## 2019-07-01 DIAGNOSIS — Z113 Encounter for screening for infections with a predominantly sexual mode of transmission: Secondary | ICD-10-CM

## 2019-07-01 LAB — WET PREP FOR TRICH, YEAST, CLUE
Trichomonas Exam: NEGATIVE
Yeast Exam: NEGATIVE

## 2019-07-01 NOTE — Progress Notes (Signed)
Wet mount reviewed, no tx per standing order. Provider orders completed. 

## 2019-07-01 NOTE — Progress Notes (Signed)
Pt here for STD screening. Pt desires blood work for HIV and syphillis.Ronny Bacon, RN

## 2019-07-01 NOTE — Progress Notes (Signed)
    STI clinic/screening visit  Subjective:  Traci Petersen is a 28 y.o. female being seen today for an STI screening visit. The patient reports they do not have symptoms.  Patient has the following medical conditions:   Patient Active Problem List   Diagnosis Date Noted  . Vaginal bleeding in pregnancy 01/06/2015  . Labor and delivery indication for care or intervention 01/06/2015  . Labor and delivery, indication for care 12/21/2014     Chief Complaint  Patient presents with  . SEXUALLY TRANSMITTED DISEASE    STD screening    HPI  Patient reports here for an STD screen .  She started her period yesterday.  Her birth control is the patch.  See flowsheet for further details and programmatic requirements.    The following portions of the patient's history were reviewed and updated as appropriate: allergies, current medications, past medical history, past social history, past surgical history and problem list.  Objective:  There were no vitals filed for this visit.  Physical Exam Constitutional:      Appearance: She is obese.  HENT:     Mouth/Throat:     Mouth: Mucous membranes are moist.     Pharynx: Oropharynx is clear. No oropharyngeal exudate.  Neck:     Musculoskeletal: Neck supple. No muscular tenderness.  Abdominal:     Palpations: Abdomen is soft.     Tenderness: There is no abdominal tenderness.  Genitourinary:    General: Normal vulva.     Labia:        Right: No rash, tenderness or lesion.        Left: No rash, tenderness or lesion.      Vagina: Bleeding present.     Cervix: Normal.     Comments: Blood tinged disch. Bimanual not indicated Lymphadenopathy:     Cervical: No cervical adenopathy.     Lower Body: No right inguinal adenopathy. No left inguinal adenopathy.  Skin:    General: Skin is warm and dry.     Findings: No lesion or rash.  Neurological:     Mental Status: She is alert.    Assessment and Plan:  Traci Petersen is a 28 y.o.  female presenting to the [facility] Department for STI screening  1. Screening examination for venereal disease  - WET PREP FOR TRICH, YEAST, CLUE   Treat wet prep as per SO. - Chlamydia/Gonorrhea [facility] - HIV [place] [facility] - Syphilis Serology, [place] [facility]     No follow-ups on file.  No future appointments.  Hassell Done, FNP

## 2019-07-13 ENCOUNTER — Telehealth: Payer: Self-pay

## 2019-07-13 ENCOUNTER — Ambulatory Visit: Payer: Self-pay

## 2019-07-13 ENCOUNTER — Other Ambulatory Visit: Payer: Self-pay

## 2019-07-13 DIAGNOSIS — A749 Chlamydial infection, unspecified: Secondary | ICD-10-CM

## 2019-07-13 MED ORDER — AZITHROMYCIN 500 MG PO TABS
500.0000 mg | ORAL_TABLET | Freq: Once | ORAL | Status: AC
  Administered 2019-07-13: 1000 mg via ORAL

## 2019-07-13 NOTE — Progress Notes (Signed)
Client declined partner card stating she has told partner about need for treatment. Rich Number, RN

## 2019-07-13 NOTE — Telephone Encounter (Signed)
TC to patient. Verified ID via password/SS#. Informed of positive chlamydia and need for tx. Instructed to eat before visit and have partner call for tx appt. Appt scheduled.Crystalmarie Yasin, RN    

## 2019-09-30 ENCOUNTER — Other Ambulatory Visit (HOSPITAL_COMMUNITY): Admission: RE | Admit: 2019-09-30 | Payer: BLUE CROSS/BLUE SHIELD | Source: Ambulatory Visit

## 2019-09-30 ENCOUNTER — Other Ambulatory Visit: Payer: Self-pay

## 2019-09-30 ENCOUNTER — Ambulatory Visit (INDEPENDENT_AMBULATORY_CARE_PROVIDER_SITE_OTHER): Payer: BLUE CROSS/BLUE SHIELD | Admitting: Certified Nurse Midwife

## 2019-09-30 ENCOUNTER — Encounter: Payer: Self-pay | Admitting: Certified Nurse Midwife

## 2019-09-30 VITALS — BP 107/63 | HR 70 | Ht 66.5 in | Wt 166.5 lb

## 2019-09-30 DIAGNOSIS — Z113 Encounter for screening for infections with a predominantly sexual mode of transmission: Secondary | ICD-10-CM | POA: Diagnosis not present

## 2019-09-30 MED ORDER — DROSPIRENONE-ETHINYL ESTRADIOL 3-0.02 MG PO TABS: 1.0000 | ORAL_TABLET | Freq: Every day | ORAL | 11 refills | Status: DC

## 2019-09-30 NOTE — Progress Notes (Signed)
GYN ENCOUNTER NOTE  Subjective:       Traci Petersen is a 29 y.o. G43P2002 female is here for gynecologic evaluation of the following issues:  1. STD testing. She is denies new partners or any symptoms but is sexually active and likes to be tested regularly. In addition is ask for a different BC option. Her current BC is patch and she states she is breaking out with it. .     Gynecologic History Patient's last menstrual period was 09/16/2019 (exact date). Contraception: Ortho-Evra patches weekly, switching to pill (yaz) Last Pap: 11/22/17. Results were: normal Last mammogram: n/a   Obstetric History OB History  Gravida Para Term Preterm AB Living  2 2 2     2   SAB TAB Ectopic Multiple Live Births        0 2    # Outcome Date GA Lbr Len/2nd Weight Sex Delivery Anes PTL Lv  2 Term 01/06/15 [redacted]w[redacted]d 06:37 / 00:20 6 lb 6.8 oz (2.914 kg) F Vag-Spont EPI  LIV  1 Term 10/20/12   6 lb 5 oz (2.863 kg) F Vag-Spont  N LIV    Past Medical History:  Diagnosis Date  . Bunion   . Chlamydia   . Vaginal Pap smear, abnormal     Past Surgical History:  Procedure Laterality Date  . FOOT SURGERY      Current Outpatient Medications on File Prior to Visit  Medication Sig Dispense Refill  . atorvastatin (LIPITOR) 40 MG tablet Take 40 mg by mouth daily.    . clindamycin-benzoyl peroxide (BENZACLIN) gel Spot treat daily to acne bumps if needed for flares.    . clindamycin-benzoyl peroxide (BENZACLIN) gel SPOT TREAT DAILY TO ACNE BUMPS IF NEEDED FOR FLARES.    Marland Kitchen ketoconazole (NIZORAL) 2 % shampoo LATHER ON TO FACE FOR 3 5 MINUTES, THEN RINSE OFF. DO THIS THREE TIMES WEEKLY IF NEEDED FOR FLARES.    Marland Kitchen norelgestromin-ethinyl estradiol (ORTHO EVRA) 150-35 MCG/24HR transdermal patch Place 1 patch onto the skin once a week. 3 patch 12  . tretinoin (RETIN-A) 0.025 % cream PLEASE SEE ATTACHED FOR DETAILED DIRECTIONS     No current facility-administered medications on file prior to visit.    No Known  Allergies  Social History   Socioeconomic History  . Marital status: Single    Spouse name: Not on file  . Number of children: Not on file  . Years of education: Not on file  . Highest education level: Not on file  Occupational History  . Not on file  Tobacco Use  . Smoking status: Never Smoker  . Smokeless tobacco: Never Used  Substance and Sexual Activity  . Alcohol use: No  . Drug use: No  . Sexual activity: Yes    Birth control/protection: Patch  Other Topics Concern  . Not on file  Social History Narrative  . Not on file   Social Determinants of Health   Financial Resource Strain:   . Difficulty of Paying Living Expenses: Not on file  Food Insecurity:   . Worried About Charity fundraiser in the Last Year: Not on file  . Ran Out of Food in the Last Year: Not on file  Transportation Needs:   . Lack of Transportation (Medical): Not on file  . Lack of Transportation (Non-Medical): Not on file  Physical Activity:   . Days of Exercise per Week: Not on file  . Minutes of Exercise per Session: Not on file  Stress:   .  Feeling of Stress : Not on file  Social Connections:   . Frequency of Communication with Friends and Family: Not on file  . Frequency of Social Gatherings with Friends and Family: Not on file  . Attends Religious Services: Not on file  . Active Member of Clubs or Organizations: Not on file  . Attends Banker Meetings: Not on file  . Marital Status: Not on file  Intimate Partner Violence:   . Fear of Current or Ex-Partner: Not on file  . Emotionally Abused: Not on file  . Physically Abused: Not on file  . Sexually Abused: Not on file    Family History  Problem Relation Age of Onset  . Breast cancer Paternal Grandmother   . Hypertension Paternal Grandmother   . Heart disease Father   . Hypertension Father   . Heart murmur Daughter   . Down syndrome Daughter   . Alcohol abuse Maternal Grandfather   . Alcohol abuse Paternal  Grandfather     The following portions of the patient's history were reviewed and updated as appropriate: allergies, current medications, past family history, past medical history, past social history, past surgical history and problem list.  Review of Systems Review of Systems - Negative except as mentioned in HPI Review of Systems - General ROS: negative for - chills, fatigue, fever, hot flashes, malaise or night sweats Hematological and Lymphatic ROS: negative for - bleeding problems or swollen lymph nodes Gastrointestinal ROS: negative for - abdominal pain, blood in stools, change in bowel habits and nausea/vomiting Musculoskeletal ROS: negative for - joint pain, muscle pain or muscular weakness Genito-Urinary ROS: negative for - change in menstrual cycle, dysmenorrhea, dyspareunia, dysuria, genital discharge, genital ulcers, hematuria, incontinence, irregular/heavy menses, nocturia or pelvic pain.   Objective:   BP 107/63   Pulse 70   Ht 5' 6.5" (1.689 m)   Wt 166 lb 8 oz (75.5 kg)   LMP 09/16/2019 (Exact Date)   BMI 26.47 kg/m  CONSTITUTIONAL: Well-developed, well-nourished female in no acute distress.  HENT:  Normocephalic, atraumatic.  NECK: Normal range of motion, supple, no masses.  Normal thyroid.  SKIN: Skin is warm and dry. No rash noted. Not diaphoretic. No erythema. No pallor. NEUROLGIC: Alert and oriented to person, place, and time. PSYCHIATRIC: Normal mood and affect. Normal behavior. Normal judgment and thought content. CARDIOVASCULAR:Not Examined RESPIRATORY: Not Examined BREASTS: Not Examined ABDOMEN: Soft, non distended; Non tender.  No Organomegaly. PELVIC:  External Genitalia: Normal  BUS: Normal  Vagina: Normal  Cervix: Normal, clear mucus discharge, ectropion present, swab collected MUSCULOSKELETAL: Normal range of motion. No tenderness.  No cyanosis, clubbing, or edema.  Assessment:   STD Testing   Plan:   Discussed BC options. She would like to  try the pill. Yaz ordered. She denies any contraindications to use. Swab collected, blood work ordered. Will follow up with results. Return for annual exam or PRN.   Doreene Burke, CNM

## 2019-09-30 NOTE — Patient Instructions (Signed)

## 2019-10-01 LAB — HEPATITIS B SURFACE ANTIGEN: Hepatitis B Surface Ag: NEGATIVE

## 2019-10-01 LAB — RPR: RPR Ser Ql: NONREACTIVE

## 2019-10-01 LAB — HIV ANTIBODY (ROUTINE TESTING W REFLEX): HIV Screen 4th Generation wRfx: NONREACTIVE

## 2019-10-01 LAB — HSV 1 AND 2 IGM ABS, INDIRECT
HSV 1 IgM: <1:10 {titer}
HSV 2 IgM: <1:10 {titer}

## 2019-10-02 LAB — CERVICOVAGINAL ANCILLARY ONLY
Bacterial Vaginitis (gardnerella): POSITIVE — AB
Candida Glabrata: NEGATIVE
Candida Vaginitis: NEGATIVE
Chlamydia: NEGATIVE
Comment: NEGATIVE
Comment: NEGATIVE
Comment: NEGATIVE
Comment: NEGATIVE
Comment: NEGATIVE
Comment: NORMAL
Neisseria Gonorrhea: NEGATIVE
Trichomonas: NEGATIVE

## 2019-10-03 ENCOUNTER — Other Ambulatory Visit: Payer: Self-pay | Admitting: Certified Nurse Midwife

## 2019-10-03 MED ORDER — METRONIDAZOLE 500 MG PO TABS: 500.0000 mg | ORAL_TABLET | Freq: Two times a day (BID) | ORAL | 0 refills | Status: AC

## 2019-10-03 NOTE — Progress Notes (Signed)
Vaginal swab positive for BV orders placed for treatment. Pt notified via my chart.   Arbell Wycoff, CNM  

## 2019-10-12 ENCOUNTER — Telehealth: Payer: Self-pay | Admitting: Certified Nurse Midwife

## 2019-10-12 NOTE — Telephone Encounter (Signed)
Patient called saying she had a visit with her PCP and informed her that the birth control she was prescribed causes a higher risk of cardio vascular complications. Patient would like to change her birth control.  -TC

## 2019-11-02 ENCOUNTER — Other Ambulatory Visit: Payer: Self-pay

## 2019-11-02 ENCOUNTER — Encounter: Payer: Self-pay | Admitting: Physician Assistant

## 2019-11-02 ENCOUNTER — Ambulatory Visit: Payer: BLUE CROSS/BLUE SHIELD | Admitting: Physician Assistant

## 2019-11-02 DIAGNOSIS — E7800 Pure hypercholesterolemia, unspecified: Secondary | ICD-10-CM

## 2019-11-02 DIAGNOSIS — Z113 Encounter for screening for infections with a predominantly sexual mode of transmission: Secondary | ICD-10-CM

## 2019-11-02 LAB — WET PREP FOR TRICH, YEAST, CLUE
Trichomonas Exam: NEGATIVE
Yeast Exam: NEGATIVE

## 2019-11-02 NOTE — Progress Notes (Signed)
[facility] Department STI clinic/screening visit  Subjective:  Zarria Towell is a 29 y.o. female being seen today for an STI screening visit. The patient reports they do not have symptoms.  Patient reports that they do not desire a pregnancy in the next year.   They reported they are not interested in discussing contraception today.  No LMP recorded.   Patient has the following medical conditions:  There are no problems to display for this patient.   Chief Complaint  Patient presents with  . SEXUALLY TRANSMITTED DISEASE    STD screening including bloodwork    HPI  Patient reports that she was having some vaginal irritation for 2 days last week after changing soap, but that has resolved.  Denies current symptoms and would like a screening today.  LMP 10/01/2019 and normal.  States that she is using OCs but plans change to other method and has appt to discuss with PCP.  See flowsheet for further details and programmatic requirements.    The following portions of the patient's history were reviewed and updated as appropriate: allergies, current medications, past medical history, past social history, past surgical history and problem list.  Objective:  There were no vitals filed for this visit.  Physical Exam Constitutional:      General: She is not in acute distress.    Appearance: Normal appearance. She is normal weight.  HENT:     Head: Normocephalic and atraumatic.     Comments: No nits, lice or hair loss. No cervical, supraclavicular or axillary adenopathy.    Mouth/Throat:     Mouth: Mucous membranes are moist.     Pharynx: Oropharynx is clear. No oropharyngeal exudate or posterior oropharyngeal erythema.  Eyes:     Conjunctiva/sclera: Conjunctivae normal.  Pulmonary:     Effort: Pulmonary effort is normal.  Abdominal:     Palpations: Abdomen is soft. There is no mass.     Tenderness: There is no abdominal tenderness. There is no guarding or rebound.   Genitourinary:    General: Normal vulva.     Rectum: Normal.     Comments: External genitalia/pubic area without nits, lice, edema, erythema, lesions and inguinal adenopathy. Vagina with normal mucosa, small amount of white discharge, pH=4.5. Cervix without visible lesions. Uterus firm, mobile,nt, no masses, no CMT, no adnexal tenderness or fullness. Musculoskeletal:     Cervical back: Neck supple. No tenderness.  Skin:    General: Skin is warm and dry.     Findings: No bruising, erythema, lesion or rash.  Neurological:     Mental Status: She is alert and oriented to person, place, and time.  Psychiatric:        Mood and Affect: Mood normal.        Behavior: Behavior normal.        Thought Content: Thought content normal.        Judgment: Judgment normal.      Assessment and Plan:  Trixy Loyola is a 29 y.o. female presenting to the [facility] Department for STI screening  1. Screening for STD (sexually transmitted disease) Patient into clinic without symptoms. Rec condoms with all sex. Await test results.  Counseled that RN will call if needs to RTC for treatment once results are back. Enc patient to keep appt to discuss change of BCM with PCP as scheduled. - WET PREP FOR TRICH, YEAST, CLUE - Gonococcus culture - Chlamydia/Gonorrhea [place] [facility] - HIV [place] [facility] - Syphilis Serology,   [facility]     No follow-ups on file.  Future Appointments  Date Time Provider Department Center  04/27/2020  2:30 PM Doreene Burke, CNM [facility] None    Marylynn Pearson [place], [place]

## 2019-11-02 NOTE — Progress Notes (Signed)
Wet mount reviewed, no tx per standing order. Provider orders completed. 

## 2019-11-07 LAB — GONOCOCCUS CULTURE

## 2019-11-10 ENCOUNTER — Telehealth: Payer: Self-pay

## 2019-11-10 DIAGNOSIS — A749 Chlamydial infection, unspecified: Secondary | ICD-10-CM

## 2019-11-10 NOTE — Telephone Encounter (Signed)
TC to patient. Verified ID via password/SS#. Informed of positive chlamydia and need for tx. Instructed to eat before visit and have partner call for tx appt. Appt scheduled.Tamatha Gadbois, RN    

## 2019-11-17 ENCOUNTER — Other Ambulatory Visit: Payer: Self-pay

## 2019-11-17 ENCOUNTER — Ambulatory Visit: Payer: Self-pay

## 2019-11-17 DIAGNOSIS — A749 Chlamydial infection, unspecified: Secondary | ICD-10-CM

## 2019-11-17 MED ORDER — AZITHROMYCIN 500 MG PO TABS
1000.0000 mg | ORAL_TABLET | Freq: Once | ORAL | Status: AC
  Administered 2019-11-17: 1000 mg via ORAL

## 2019-11-17 NOTE — Progress Notes (Signed)
Pt tx'd for chlamydia per SO.Richmond Campbell, RN

## 2020-03-29 ENCOUNTER — Other Ambulatory Visit: Payer: Self-pay

## 2020-03-29 ENCOUNTER — Ambulatory Visit: Payer: Self-pay | Admitting: Physician Assistant

## 2020-03-29 ENCOUNTER — Ambulatory Visit (LOCAL_COMMUNITY_HEALTH_CENTER): Payer: BLUE CROSS/BLUE SHIELD

## 2020-03-29 DIAGNOSIS — Z7189 Other specified counseling: Secondary | ICD-10-CM

## 2020-03-29 DIAGNOSIS — Z113 Encounter for screening for infections with a predominantly sexual mode of transmission: Secondary | ICD-10-CM

## 2020-03-29 LAB — WET PREP FOR TRICH, YEAST, CLUE
Trichomonas Exam: NEGATIVE
Yeast Exam: NEGATIVE

## 2020-03-29 NOTE — Progress Notes (Signed)
Pt to confirm Tdap vaccination dated 08/25/14 in "reconcile" section of Epic; it does not give details as to the facility that administered it. Wet mount reviewed, pt denies symptoms, no tx per provider orders. Provider orders addressed and completed.

## 2020-03-29 NOTE — Progress Notes (Signed)
Pt requesting Tdap. Pt to confirm Tdap vaccination dated 08/25/14 in "reconcile" section of Epic; it does not give details as to the facility that administered it.

## 2020-03-30 ENCOUNTER — Encounter: Payer: Self-pay | Admitting: Physician Assistant

## 2020-03-30 NOTE — Progress Notes (Signed)
  [facility] Department STI clinic/screening visit  Subjective:  Traci Petersen is a 29 y.o. female being seen today for an STI screening visit. The patient reports they do not have symptoms.  Patient reports that they do not desire a pregnancy in the next year.   They reported they are not interested in discussing contraception today.  Patient's last menstrual period was 03/12/2020.   Patient has the following medical conditions:   Patient Active Problem List   Diagnosis Date Noted  . Hypercholesterolemia 11/02/2019    Chief Complaint  Patient presents with  . SEXUALLY TRANSMITTED DISEASE    screening    HPI  Patient reports that she is not having any symptoms but would like a screening today.  Reports that she takes medicine for her cholesterol.  States that her last HIV test was about 4-5 months ago and last pap was in 2020.  See flowsheet for further details and programmatic requirements.    The following portions of the patient's history were reviewed and updated as appropriate: allergies, current medications, past medical history, past social history, past surgical history and problem list.  Objective:  There were no vitals filed for this visit.  Physical Exam Constitutional:      General: She is not in acute distress.    Appearance: Normal appearance.  HENT:     Head: Normocephalic and atraumatic.     Comments: No nits, lice, or hair loss. No cervical, supraclavicular or axillary adenopathy.    Mouth/Throat:     Pharynx: No posterior oropharyngeal erythema.  Eyes:     Conjunctiva/sclera: Conjunctivae normal.  Pulmonary:     Effort: Pulmonary effort is normal.  Skin:    General: Skin is warm and dry.     Findings: No bruising, erythema, lesion or rash.  Neurological:     Mental Status: She is alert and oriented to person, place, and time.  Psychiatric:        Mood and Affect: Mood normal.        Behavior: Behavior normal.        Thought Content:  Thought content normal.        Judgment: Judgment normal.    Patient opts to self-collect vaginal samples for testing today.   Assessment and Plan:  Traci Petersen is a 29 y.o. female presenting to the [facility] Department for STI screening  1. Screening for STD (sexually transmitted disease) Patient into clinic without symptoms. Counseled patient how to collect vaginal samples for GC/Chlamydia and wet mount.  Rec condoms with all sex. Await test results.  Counseled that RN will call if needs to RTC for treatment once results are back. - WET PREP FOR TRICH, YEAST, CLUE - Chlamydia/Gonorrhea [place] [facility] - Syphilis Serology, [place] [facility] - HIV [place] [facility]     No follow-ups on file.  Future Appointments  Date Time Provider Department Center  04/27/2020  2:30 PM Doreene Burke, CNM [facility] None    Marylynn Pearson [place], [place]

## 2020-04-27 ENCOUNTER — Ambulatory Visit (INDEPENDENT_AMBULATORY_CARE_PROVIDER_SITE_OTHER): Payer: BLUE CROSS/BLUE SHIELD | Admitting: Certified Nurse Midwife

## 2020-04-27 ENCOUNTER — Inpatient Hospital Stay (HOSPITAL_COMMUNITY): Admit: 2020-04-27 | Payer: BLUE CROSS/BLUE SHIELD

## 2020-04-27 ENCOUNTER — Encounter: Payer: Self-pay | Admitting: Certified Nurse Midwife

## 2020-04-27 ENCOUNTER — Other Ambulatory Visit: Payer: Self-pay

## 2020-04-27 VITALS — BP 112/63 | HR 66 | Ht 66.5 in | Wt 169.2 lb

## 2020-04-27 DIAGNOSIS — Z01419 Encounter for gynecological examination (general) (routine) without abnormal findings: Secondary | ICD-10-CM

## 2020-04-27 DIAGNOSIS — Z1159 Encounter for screening for other viral diseases: Secondary | ICD-10-CM | POA: Diagnosis not present

## 2020-04-27 DIAGNOSIS — N898 Other specified noninflammatory disorders of vagina: Secondary | ICD-10-CM

## 2020-04-27 DIAGNOSIS — Z13220 Encounter for screening for lipoid disorders: Secondary | ICD-10-CM | POA: Diagnosis not present

## 2020-04-27 DIAGNOSIS — R350 Frequency of micturition: Secondary | ICD-10-CM

## 2020-04-27 NOTE — Progress Notes (Signed)
GYNECOLOGY ANNUAL PREVENTATIVE CARE ENCOUNTER NOTE  History:     Traci Petersen is a 29 y.o. G81P2002 female here for a routine annual gynecologic exam.  Current complaints: increased vaginal discharge. .   Denies abnormal vaginal bleeding, discharge, pelvic pain, problems with intercourse or other gynecologic concerns.     Social Relationship: none Living:with 2 daughters 5/7 yr old  Chiropractor for assisted living, in school for coding and billing Exercise:2 x [employer] and walking dong Smoke/Alcohol/drug VQM:GQQPYP   Gynecologic History Patient's last menstrual period was 04/05/2020 (exact date). Contraception: OCP (estrogen/progesterone) Last Pap:  11/22/2017 Results were: normal  Last mammogram: n/a   Obstetric History OB History  Gravida Para Term Preterm AB Living  2 2 2     2   SAB TAB Ectopic Multiple Live Births        0 2    # Outcome Date GA Lbr Len/2nd Weight Sex Delivery Anes PTL Lv  2 Term 01/06/15 [redacted]w[redacted]d 06:37 / 00:20 6 lb 6.8 oz (2.914 kg) F Vag-Spont EPI  LIV  1 Term 10/20/12   6 lb 5 oz (2.863 kg) F Vag-Spont  N LIV    Past Medical History:  Diagnosis Date  . Bunion   . Chlamydia   . Vaginal Pap smear, abnormal     Past Surgical History:  Procedure Laterality Date  . FOOT SURGERY      Current Outpatient Medications on File Prior to Visit  Medication Sig Dispense Refill  . atorvastatin (LIPITOR) 40 MG tablet Take 40 mg by mouth daily.    . clindamycin-benzoyl peroxide (BENZACLIN) gel Spot treat daily to acne bumps if needed for flares.    . clindamycin-benzoyl peroxide (BENZACLIN) gel SPOT TREAT DAILY TO ACNE BUMPS IF NEEDED FOR FLARES.    12/20/12 ketoconazole (NIZORAL) 2 % shampoo LATHER ON TO FACE FOR 3 5 MINUTES, THEN RINSE OFF. DO THIS THREE TIMES WEEKLY IF NEEDED FOR FLARES.    Marland Kitchen norgestimate-ethinyl estradiol (SPRINTEC 28) 0.25-35 MG-MCG tablet Take 1 tablet by mouth daily.    04-29-1999 tretinoin (RETIN-A) 0.025 % cream PLEASE SEE  ATTACHED FOR DETAILED DIRECTIONS    . drospirenone-ethinyl estradiol (YAZ) 3-0.02 MG tablet Take 1 tablet by mouth daily. (Patient not taking: Reported on 04/27/2020) 1 Package 11   No current facility-administered medications on file prior to visit.    No Known Allergies  Social History:  reports that she has never smoked. She has never used smokeless tobacco. She reports that she does not drink alcohol and does not use drugs.  Family History  Problem Relation Age of Onset  . Breast cancer Paternal Grandmother   . Hypertension Paternal Grandmother   . Heart disease Father   . Hypertension Father   . Heart murmur Daughter   . Down syndrome Daughter   . Alcohol abuse Maternal Grandfather   . Alcohol abuse Paternal Grandfather     The following portions of the patient's history were reviewed and updated as appropriate: allergies, current medications, past family history, past medical history, past social history, past surgical history and problem list.  Review of Systems Pertinent items noted in HPI and remainder of comprehensive ROS otherwise negative.  Physical Exam:  BP 112/63   Pulse 66   Ht 5' 6.5" (1.689 m)   Wt 169 lb 3 oz (76.7 kg)   LMP 04/05/2020 (Exact Date)   BMI 26.90 kg/m  CONSTITUTIONAL: Well-developed, well-nourished female in no acute distress.  HENT:  Normocephalic, atraumatic, External  right and left ear normal. Oropharynx is clear and moist EYES: Conjunctivae and EOM are normal. Pupils are equal, round, and reactive to light. No scleral icterus.  NECK: Normal range of motion, supple, no masses.  Normal thyroid.  SKIN: Skin is warm and dry. No rash noted. Not diaphoretic. No erythema. No pallor. MUSCULOSKELETAL: Normal range of motion. No tenderness.  No cyanosis, clubbing, or edema.  2+ distal pulses. NEUROLOGIC: Alert and oriented to person, place, and time. Normal reflexes, muscle tone coordination.  PSYCHIATRIC: Normal mood and affect. Normal behavior.  Normal judgment and thought content. CARDIOVASCULAR: Normal heart rate noted, regular rhythm RESPIRATORY: Clear to auscultation bilaterally. Effort and breath sounds normal, no problems with respiration noted. BREASTS: Symmetric in size. No masses, tenderness, skin changes, nipple drainage, or lymphadenopathy bilaterally.  ABDOMEN: Soft, no distention noted.  No tenderness, rebound or guarding.  PELVIC: Normal appearing external genitalia and urethral meatus; normal appearing vaginal mucosa and cervix.  No abnormal discharge noted. White to yellow discharge no odor.  Pap smear not due.   Normal uterine size, no other palpable masses, no uterine or adnexal tenderness.  .   Assessment and Plan:    1. Women's annual routine gynecological examination    Pap:not due Mammogram : n/a  Labs:Hep C, Lipid  Refills:none Referral: none Information given on genetic testing for BRACa gene.  Routine preventative health maintenance measures emphasized. Please refer to After Visit Summary for other counseling recommendations.      Doreene Burke, CNM [facility],  [facility]

## 2020-04-27 NOTE — Patient Instructions (Signed)

## 2020-04-28 LAB — LIPID PANEL
Chol/HDL Ratio: 4.1 ratio (ref 0.0–4.4)
Cholesterol, Total: 235 mg/dL — ABNORMAL HIGH (ref 100–199)
HDL: 58 mg/dL (ref >39)
LDL Chol Calc (NIH): 162 mg/dL — ABNORMAL HIGH (ref 0–99)
Triglycerides: 87 mg/dL (ref 0–149)
VLDL Cholesterol Cal: 15 mg/dL (ref 5–40)

## 2020-04-28 LAB — HEPATITIS C ANTIBODY: Hep C Virus Ab: 0.1 s/co ratio (ref 0.0–0.9)

## 2020-04-29 LAB — CERVICOVAGINAL ANCILLARY ONLY
Bacterial Vaginitis (gardnerella): POSITIVE — AB
Candida Glabrata: NEGATIVE
Candida Vaginitis: POSITIVE — AB
Chlamydia: NEGATIVE
Comment: NEGATIVE
Comment: NEGATIVE
Comment: NEGATIVE
Comment: NEGATIVE
Comment: NEGATIVE
Comment: NORMAL
Neisseria Gonorrhea: NEGATIVE
Trichomonas: POSITIVE — AB

## 2020-04-29 LAB — URINE CULTURE

## 2020-05-03 ENCOUNTER — Other Ambulatory Visit: Payer: Self-pay | Admitting: Certified Nurse Midwife

## 2020-05-03 DIAGNOSIS — B9689 Other specified bacterial agents as the cause of diseases classified elsewhere: Secondary | ICD-10-CM

## 2020-05-03 DIAGNOSIS — A599 Trichomoniasis, unspecified: Secondary | ICD-10-CM

## 2020-05-03 DIAGNOSIS — N760 Acute vaginitis: Secondary | ICD-10-CM

## 2020-05-03 DIAGNOSIS — B373 Candidiasis of vulva and vagina: Secondary | ICD-10-CM

## 2020-05-03 DIAGNOSIS — B3731 Acute candidiasis of vulva and vagina: Secondary | ICD-10-CM

## 2020-05-03 MED ORDER — METRONIDAZOLE 500 MG PO TABS: 500.0000 mg | ORAL_TABLET | Freq: Two times a day (BID) | ORAL | 0 refills | Status: AC

## 2020-05-03 MED ORDER — FLUCONAZOLE 150 MG PO TABS: 150.0000 mg | ORAL_TABLET | Freq: Once | ORAL | 0 refills | Status: AC

## 2020-05-03 NOTE — Progress Notes (Signed)
Rx: Flagyl and Diflucan, see orders.    Serafina Royals, CNM [facility], [facility] 05/03/20 11:57 AM

## 2020-05-03 NOTE — Progress Notes (Signed)
Done. JML

## 2020-06-17 ENCOUNTER — Ambulatory Visit: Payer: Self-pay | Admitting: Advanced Practice Midwife

## 2020-06-17 ENCOUNTER — Encounter: Payer: Self-pay | Admitting: Advanced Practice Midwife

## 2020-06-17 ENCOUNTER — Telehealth: Payer: Self-pay

## 2020-06-17 ENCOUNTER — Other Ambulatory Visit: Payer: Self-pay

## 2020-06-17 ENCOUNTER — Other Ambulatory Visit: Payer: Self-pay | Admitting: Certified Nurse Midwife

## 2020-06-17 DIAGNOSIS — Z113 Encounter for screening for infections with a predominantly sexual mode of transmission: Secondary | ICD-10-CM

## 2020-06-17 DIAGNOSIS — E7800 Pure hypercholesterolemia, unspecified: Secondary | ICD-10-CM

## 2020-06-17 LAB — WET PREP FOR TRICH, YEAST, CLUE
Trichomonas Exam: NEGATIVE
Yeast Exam: NEGATIVE

## 2020-06-17 NOTE — Progress Notes (Signed)
[facility] Department STI clinic/screening visit  Subjective:  Traci Petersen is a 29 y.o.SBF nonsmoker G2P2 female being seen today for an STI screening visit. The patient reports they do have symptoms.  Patient reports that they do not desire a pregnancy in the next year.   They reported they are not interested in discussing contraception today.  Patient's last menstrual period was 06/02/2020.   Patient has the following medical conditions:   Patient Active Problem List   Diagnosis Date Noted  . Hypercholesterolemia 11/02/2019    No chief complaint on file.   HPI  Patient reports c/o white-clear d/c x 3-4 days.  LMP 06/02/20.  Last sex 06/10/20 without condom; with current partner x 1 year; 1 sex partner in last 3 mo.  Last ETOH 05/28/20 (1 glass wine) 2x/mo.    Last HIV test per patient/review of record was 03/29/20 Patient reports last pap was 11/22/17 neg  See flowsheet for further details and programmatic requirements.    The following portions of the patient's history were reviewed and updated as appropriate: allergies, current medications, past medical history, past social history, past surgical history and problem list.  Objective:  There were no vitals filed for this visit.  Physical Exam Vitals and nursing note reviewed.  Constitutional:      Appearance: Normal appearance.  HENT:     Head: Normocephalic and atraumatic.     Mouth/Throat:     Mouth: Mucous membranes are moist.     Pharynx: Oropharynx is clear. No oropharyngeal exudate or posterior oropharyngeal erythema.  Eyes:     Conjunctiva/sclera: Conjunctivae normal.  Pulmonary:     Effort: Pulmonary effort is normal.  Abdominal:     Palpations: Abdomen is soft. There is no mass.     Tenderness: There is no abdominal tenderness. There is no rebound.     Comments: Soft without tenderness, fair tone  Genitourinary:    General: Normal vulva.     Exam position: Lithotomy position.     Pubic  Area: No rash or pubic lice.      Labia:        Right: No rash or lesion.        Left: No rash or lesion.      Vagina: Vaginal discharge (white creamy leukorrhea, ph<4.5) present. No erythema, bleeding or lesions.     Cervix: Normal.     Uterus: Normal.      Adnexa: Right adnexa normal and left adnexa normal.     Rectum: Normal.  Lymphadenopathy:     Head:     Right side of head: No preauricular or posterior auricular adenopathy.     Left side of head: No preauricular or posterior auricular adenopathy.     Cervical: No cervical adenopathy.     Upper Body:     Right upper body: No supraclavicular or axillary adenopathy.     Left upper body: No supraclavicular or axillary adenopathy.     Lower Body: No right inguinal adenopathy. No left inguinal adenopathy.  Skin:    General: Skin is warm and dry.     Findings: No rash.  Neurological:     Mental Status: She is alert and oriented to person, place, and time.      Assessment and Plan:  Traci Petersen is a 29 y.o. female presenting to the [facility] Department for STI screening  1. Screening examination for venereal disease Treat wet mount per standing orders Immunization nurse consult - WET PREP  FOR TRICH, YEAST, CLUE - Syphilis Serology, [place] [facility] - Chlamydia/Gonorrhea [place] [facility] - HIV [place] [facility]  2. Hypercholesterolemia On Rosuvastatin 40 mg     No follow-ups on file.  Future Appointments  Date Time Provider Department Center  05/03/2021  2:30 PM Doreene Burke, CNM [facility] None    Alberteen Spindle, CNM

## 2020-06-17 NOTE — Telephone Encounter (Signed)
Patient called in stating that she needs a refill on her birth control patches. Patient has an appointment for her AE for September of next year. Could you please advise?

## 2020-06-17 NOTE — Progress Notes (Signed)
Wet mount reviewed with provider and no treatment needed for wet mount per standing order and per provider verbal order. Provider orders completed.

## 2020-06-20 ENCOUNTER — Telehealth: Payer: Self-pay

## 2020-06-20 NOTE — Telephone Encounter (Signed)
mychart message sent to patient- refill sent 

## 2020-08-10 ENCOUNTER — Ambulatory Visit: Payer: BLUE CROSS/BLUE SHIELD | Admitting: Physician Assistant

## 2020-08-10 ENCOUNTER — Encounter: Payer: Self-pay | Admitting: Physician Assistant

## 2020-08-10 ENCOUNTER — Other Ambulatory Visit: Payer: Self-pay

## 2020-08-10 DIAGNOSIS — A5901 Trichomonal vulvovaginitis: Secondary | ICD-10-CM

## 2020-08-10 DIAGNOSIS — Z113 Encounter for screening for infections with a predominantly sexual mode of transmission: Secondary | ICD-10-CM

## 2020-08-10 LAB — WET PREP FOR TRICH, YEAST, CLUE
Trichomonas Exam: POSITIVE — AB
Yeast Exam: NEGATIVE

## 2020-08-10 MED ORDER — METRONIDAZOLE 500 MG PO TABS: 500.0000 mg | ORAL_TABLET | Freq: Two times a day (BID) | ORAL | 0 refills | Status: AC

## 2020-08-10 NOTE — Progress Notes (Signed)
Post:  RN reviewed wet mount with patient. Patient treated for Trich per S. O.   Provider orders complete.   Harvie Heck, RN

## 2020-08-10 NOTE — Progress Notes (Signed)
  [facility] Department STI clinic/screening visit  Subjective:  Cori Justus is a 29 y.o. female being seen today for an STI screening visit. The patient reports they do have symptoms.  Patient reports that they do not desire a pregnancy in the next year.   They reported they are not interested in discussing contraception today.  Patient's last menstrual period was 08/09/2020.   Patient has the following medical conditions:   Patient Active Problem List   Diagnosis Date Noted  . Hypercholesterolemia 11/02/2019    Chief Complaint  Patient presents with  . SEXUALLY TRANSMITTED DISEASE    screening    HPI  Patient reports that she has noticed an increase in the amount of her vaginal discharge since she started using her new BCM.  Denies other symptoms and reports that she takes medicine for Cholesterol, uses acne medicine and started the Xulane patch.  Last HIV test was last year and last pap was in 2019.   See flowsheet for further details and programmatic requirements.    The following portions of the patient's history were reviewed and updated as appropriate: allergies, current medications, past medical history, past social history, past surgical history and problem list.  Objective:  There were no vitals filed for this visit.  Physical Exam Constitutional:      General: She is not in acute distress.    Appearance: Normal appearance.  HENT:     Head: Normocephalic and atraumatic.  Eyes:     Conjunctiva/sclera: Conjunctivae normal.  Pulmonary:     Effort: Pulmonary effort is normal.  Skin:    General: Skin is warm and dry.     Findings: No bruising, erythema, lesion or rash.  Neurological:     Mental Status: She is alert and oriented to person, place, and time.  Psychiatric:        Mood and Affect: Mood normal.        Behavior: Behavior normal.        Thought Content: Thought content normal.        Judgment: Judgment normal.      Assessment and  Plan:  Allyssia Skluzacek is a 29 y.o. female presenting to the [facility] Department for STI screening  1. Screening for STD (sexually transmitted disease) Patient into clinic without symptoms. Patient opts to self-collect vaginal samples.  Reviewed with patient how to collect for accurate results.  Rec condoms with all sex. Await test results.  Counseled that RN will call if needs to RTC for treatment once results are back. - WET PREP FOR TRICH, YEAST, CLUE - Chlamydia/Gonorrhea [place] [facility] - HIV [place] [facility] - Syphilis Serology, [place] [facility]  2. Trichomonal vaginitis Treat for Trich with Metronidazole 500 mg #14 1 po BID for 7 days with food, no EtOH for 24 hr before and until 72 hr after completing medicine. No sex for 14 days and until after partner completes treatment. Call with questions or concerns. - metroNIDAZOLE (FLAGYL) 500 MG tablet; Take 1 tablet (500 mg total) by mouth 2 (two) times daily for 7 days.  Dispense: 14 tablet; Refill: 0     No follow-ups on file.  Future Appointments  Date Time Provider Department Center  05/03/2021  2:30 PM Doreene Burke, CNM [facility] None    Marylynn Pearson [place], [place]

## 2020-08-18 LAB — HM HIV SCREENING LAB: HM HIV Screening: NEGATIVE

## 2020-08-21 ENCOUNTER — Encounter: Payer: Self-pay | Admitting: Student

## 2020-10-02 ENCOUNTER — Other Ambulatory Visit: Payer: Self-pay

## 2020-10-02 ENCOUNTER — Emergency Department
Admission: EM | Admit: 2020-10-02 | Discharge: 2020-10-02 | Disposition: A | Discharge status: Home / Self Care | Payer: 59 | Attending: Emergency Medicine | Admitting: Emergency Medicine

## 2020-10-02 ENCOUNTER — Emergency Department: Payer: 59

## 2020-10-02 ENCOUNTER — Encounter: Payer: Self-pay | Admitting: Emergency Medicine

## 2020-10-02 DIAGNOSIS — K30 Functional dyspepsia: Secondary | ICD-10-CM | POA: Insufficient documentation

## 2020-10-02 DIAGNOSIS — R0789 Other chest pain: Secondary | ICD-10-CM | POA: Insufficient documentation

## 2020-10-02 DIAGNOSIS — R0602 Shortness of breath: Secondary | ICD-10-CM | POA: Insufficient documentation

## 2020-10-02 DIAGNOSIS — Z79899 Other long term (current) drug therapy: Secondary | ICD-10-CM | POA: Insufficient documentation

## 2020-10-02 LAB — BASIC METABOLIC PANEL
Anion gap: 7 (ref 5–15)
BUN: 14 mg/dL (ref 6–20)
CO2: 24 mmol/L (ref 22–32)
Calcium: 8.7 mg/dL — ABNORMAL LOW (ref 8.9–10.3)
Chloride: 105 mmol/L (ref 98–111)
Creatinine, Ser: 0.87 mg/dL (ref 0.44–1.00)
GFR, Estimated: >60 mL/min (ref >60)
Glucose, Bld: 102 mg/dL — ABNORMAL HIGH (ref 70–99)
Potassium: 3.8 mmol/L (ref 3.5–5.1)
Sodium: 136 mmol/L (ref 135–145)

## 2020-10-02 LAB — CBC
HCT: 38 % (ref 36.0–46.0)
Hemoglobin: 12.9 g/dL (ref 12.0–15.0)
MCH: 31.3 pg (ref 26.0–34.0)
MCHC: 33.9 g/dL (ref 30.0–36.0)
MCV: 92.2 fL (ref 80.0–100.0)
Platelets: 246 10*3/uL (ref 150–400)
RBC: 4.12 MIL/uL (ref 3.87–5.11)
RDW: 12.2 % (ref 11.5–15.5)
WBC: 7.6 10*3/uL (ref 4.0–10.5)
nRBC: 0 % (ref 0.0–0.2)

## 2020-10-02 LAB — TROPONIN I (HIGH SENSITIVITY)
Troponin I (High Sensitivity): <2 ng/L (ref <18)
Troponin I (High Sensitivity): <2 ng/L (ref <18)

## 2020-10-02 MED ORDER — ALUM & MAG HYDROXIDE-SIMETH 400-400-40 MG/5ML PO SUSP
5.0000 mL | Freq: Four times a day (QID) | ORAL | 0 refills | Status: DC | PRN
Start: 2020-10-02 — End: 2021-05-03

## 2020-10-02 MED ORDER — ALUM & MAG HYDROXIDE-SIMETH 200-200-20 MG/5ML PO SUSP
30.0000 mL | Freq: Once | ORAL | Status: AC
  Administered 2020-10-02: 30 mL via ORAL
  Filled 2020-10-02: qty 30

## 2020-10-02 MED ORDER — ALBUTEROL SULFATE (2.5 MG/3ML) 0.083% IN NEBU
5.0000 mg | INHALATION_SOLUTION | Freq: Once | RESPIRATORY_TRACT | Status: AC
  Administered 2020-10-02: 5 mg via RESPIRATORY_TRACT
  Filled 2020-10-02: qty 6

## 2020-10-02 MED ORDER — PANTOPRAZOLE SODIUM 40 MG PO TBEC: 40.0000 mg | DELAYED_RELEASE_TABLET | Freq: Every day | ORAL | 1 refills | Status: DC

## 2020-10-02 NOTE — ED Notes (Signed)
E-signature not working at this time. Pt verbalized understanding of D/C instructions, prescriptions and follow up care with no further questions at this time. Pt in NAD and ambulatory at time of D/C.  

## 2020-10-02 NOTE — ED Provider Notes (Signed)
[facility] Emergency Department Provider Note  ____________________________________________  Time seen: Approximately 5:00 AM  I have reviewed the triage vital signs and the nursing notes.   HISTORY  Chief Complaint Chest Pain   HPI Traci Petersen is a 30 y.o. female presents for evaluation of chest pain.  Patient reports that the pain started around 11PM last night while she was getting ready for bed.  She describes the pain as substernal chest tightness associated with mild shortness of breath initially.  She reports that she sat up in bed and was able to fall asleep.  She woke up at 2:30 AM and the pain was persistent which prompted visit to the emergency room.  The pain is moderate in intensity.  She denies any shortness of breath at this time.  No nausea or vomiting, no abdominal pain, no dizziness, no pleuritic chest pain.  No personal or family history of blood clots, no recent travel immobilization, no leg pain or swelling, no hemoptysis.  She does use a transdermal hormonal patch for birth control.  She denies history of GERD or indigestion.  Denies any alcohol use.  Reports that the pain started 4 hours after she ate.   No history of smoking or asthma.  She denies any recent illness, has had no cough, no congestion, no sore throat, no fever  Past Medical History:  Diagnosis Date  . Bunion   . Chlamydia   . Vaginal Pap smear, abnormal     Patient Active Problem List   Diagnosis Date Noted  . Hypercholesterolemia 11/02/2019    Past Surgical History:  Procedure Laterality Date  . FOOT SURGERY      Prior to Admission medications   Medication Sig Start Date End Date Taking? Authorizing Provider  alum & mag hydroxide-simeth (MAALOX MAX) 400-400-40 MG/5ML suspension Take 5 mLs by mouth every 6 (six) hours as needed for indigestion. 10/02/20  Yes Ravinder Lukehart, [place], MD  pantoprazole (PROTONIX) 40 MG tablet Take 1 tablet (40 mg total) by mouth daily.  10/02/20 10/02/21 Yes Annelies Coyt, [place], MD  atorvastatin (LIPITOR) 40 MG tablet Take 40 mg by mouth daily. 04/09/19   [provider]  clindamycin-benzoyl peroxide (BENZACLIN) gel Spot treat daily to acne bumps if needed for flares. Patient not taking: Reported on 08/10/2020 05/25/19   [provider]  clindamycin-benzoyl peroxide (BENZACLIN) gel SPOT TREAT DAILY TO ACNE BUMPS IF NEEDED FOR FLARES. Patient not taking: Reported on 08/10/2020 05/25/19   [provider]  drospirenone-ethinyl estradiol (YAZ) 3-0.02 MG tablet Take 1 tablet by mouth daily. Patient not taking: Reported on 04/27/2020 09/30/19   Doreene Burke, CNM  ketoconazole (NIZORAL) 2 % shampoo LATHER ON TO FACE FOR 3 5 MINUTES, THEN RINSE OFF. DO THIS THREE TIMES WEEKLY IF NEEDED FOR FLARES. Patient not taking: Reported on 08/10/2020 06/20/19   [provider]  norgestimate-ethinyl estradiol (ORTHO-CYCLEN) 0.25-35 MG-MCG tablet Take 1 tablet by mouth daily. Patient not taking: Reported on 08/10/2020    [provider]  tretinoin (RETIN-A) 0.025 % cream PLEASE SEE ATTACHED FOR DETAILED DIRECTIONS 05/25/19   [provider]  Burr Medico 150-35 MCG/24HR transdermal patch APPLY 1 PATCH ONCE A WEEK 06/20/20   Doreene Burke, CNM    Allergies Patient has no known allergies.  Family History  Problem Relation Age of Onset  . Breast cancer Paternal Grandmother   . Hypertension Paternal Grandmother   . Heart disease Father   . Hypertension Father   . Heart murmur Daughter   .  Down syndrome Daughter   . Alcohol abuse Maternal Grandfather   . Alcohol abuse Paternal Grandfather     Social History Social History   Tobacco Use  . Smoking status: Never Smoker  . Smokeless tobacco: Never Used  Substance Use Topics  . Alcohol use: Yes    Alcohol/week: 1.0 standard drink    Types: 1 Glasses of wine per week    Comment: 2x/mo  . Drug use: No    Review of Systems  Constitutional:  Negative for fever. Eyes: Negative for visual changes. ENT: Negative for sore throat. Neck: No neck pain  Cardiovascular: Negative for chest pain. + chest tightness Respiratory: Negative for shortness of breath. Gastrointestinal: Negative for abdominal pain, vomiting or diarrhea. Genitourinary: Negative for dysuria. Musculoskeletal: Negative for back pain. Skin: Negative for rash. Neurological: Negative for headaches, weakness or numbness. Psych: No SI or HI  ____________________________________________   PHYSICAL EXAM:  VITAL SIGNS: ED Triage Vitals  Enc Vitals Group     BP 10/02/20 0330 114/78     Pulse Rate 10/02/20 0330 (!) 59     Resp 10/02/20 0330 16     Temp 10/02/20 0330 98.6 F (37 C)     Temp Source 10/02/20 0330 Oral     SpO2 10/02/20 0330 99 %     Weight 10/02/20 0329 165 lb (74.8 kg)     Height 10/02/20 0329 5\' 6"  (1.676 m)     Head Circumference --      Peak Flow --      Pain Score 10/02/20 0330 8     Pain Loc --      Pain Edu? --      Excl. in GC? --     Constitutional: Alert and oriented. Well appearing and in no apparent distress. HEENT:      Head: Normocephalic and atraumatic.         Eyes: Conjunctivae are normal. Sclera is non-icteric.       Mouth/Throat: Mucous membranes are moist.       Neck: Supple with no signs of meningismus. Cardiovascular: Regular rate and rhythm. No murmurs, gallops, or rubs. 2+ symmetrical distal pulses are present in all extremities. No JVD. Respiratory: Normal respiratory effort. Lungs are clear to auscultation bilaterally with diminished air movement bilaterally Gastrointestinal: Soft, non tender, and non distended with positive bowel sounds. No rebound or guarding. Genitourinary: No CVA tenderness. Musculoskeletal:  No edema, cyanosis, or erythema of extremities. Neurologic: Normal speech and language. Face is symmetric. Moving all extremities. No gross focal neurologic deficits are appreciated. Skin: Skin is warm,  dry and intact. No rash noted. Psychiatric: Mood and affect are normal. Speech and behavior are normal.  ____________________________________________   LABS (all labs ordered are listed, but only abnormal results are displayed)  Labs Reviewed  BASIC METABOLIC PANEL - Abnormal; Notable for the following components:      Result Value   Glucose, Bld 102 (*)    Calcium 8.7 (*)    All other components within normal limits  CBC  POC URINE PREG, ED  TROPONIN I (HIGH SENSITIVITY)  TROPONIN I (HIGH SENSITIVITY)   ____________________________________________  EKG  ED ECG REPORT I, Nita Sicklearolina Jaeda Bruso, the attending physician, personally viewed and interpreted this ECG.  Normal sinus rhythm, rate of 64, normal intervals, normal axis, no ST elevations or depressions.  Normal EKG. ____________________________________________  RADIOLOGY  I have personally reviewed the images performed during this visit and I agree with the Radiologist's read.   Interpretation  by Radiologist:  DG Chest 2 View  Result Date: 10/02/2020 CLINICAL DATA:  Substernal chest pain beginning tonight. EXAM: CHEST - 2 VIEW COMPARISON:  None. FINDINGS: Heart size is normal. Mediastinal shadows are normal. Lungs are clear. The vascularity is normal. No effusions. No significant bone finding. Minimal spinal curvature which could even be positional. IMPRESSION: No active cardiopulmonary disease. Electronically Signed   By: Paulina Fusi M.D.   On: 10/02/2020 03:58     ____________________________________________   PROCEDURES  Procedure(s) performed:yes .1-3 Lead EKG Interpretation Performed by: Nita Sickle, MD Authorized by: Nita Sickle, MD     Interpretation: normal     ECG rate assessment: normal     Rhythm: sinus rhythm     Ectopy: none     Conduction: normal     Critical Care performed:  None ____________________________________________   INITIAL IMPRESSION / ASSESSMENT AND PLAN / ED  COURSE  30 y.o. female presents for evaluation of chest tightness for several hours.  Patient is extremely well-appearing in no distress with normal work of breathing, normal sats although on auscultation she does have diminished air movement bilaterally with no crackles or wheezing.  Abdomen is soft and nontender, heart regular rate and rhythm.  She looks euvolemic, no leg swelling or edema.  EKG is normal.  Differential diagnoses include indigestion versus GERD versus bronchospasms versus viral syndrome.  Atypical for ACS.  Doubt PE with no tachypnea, tachycardia, hypoxia, or pleuritic chest pain.  Chest x-ray visualized by me with no signs of pneumothorax, pneumonia, or edema, confirmed by radiology.  Labs pending.  History gathered from patient and her mother who is at bedside.  Will give albuterol treatment and reassess.  Patient placed on telemetry for close monitoring.  Old medical records reviewed.  _________________________ 7:56 AM on 10/02/2020 -----------------------------------------  No changes in the pain after albuterol the pain did resolve after GI cocktail.  Most likely indigestion.  Patient's labs with no acute findings including 2 high-sensitivity troponins which were negative.  Will discharge home with Maalox and protonix and follow-up with PCP.  Discussed my standard return precautions     _____________________________________________ Please note:  Patient was evaluated in Emergency Department today for the symptoms described in the history of present illness. Patient was evaluated in the context of the global COVID-19 pandemic, which necessitated consideration that the patient might be at risk for infection with the SARS-CoV-2 virus that causes COVID-19. Institutional protocols and algorithms that pertain to the evaluation of patients at risk for COVID-19 are in a state of rapid change based on information released by regulatory bodies including the CDC and federal and state  organizations. These policies and algorithms were followed during the patient's care in the ED.  Some ED evaluations and interventions may be delayed as a result of limited staffing during the pandemic.   [place] Controlled Substance Database was reviewed by me. ____________________________________________   FINAL CLINICAL IMPRESSION(S) / ED DIAGNOSES   Final diagnoses:  Atypical chest pain  Indigestion      NEW MEDICATIONS STARTED DURING THIS VISIT:  ED Discharge Orders         Ordered    alum & mag hydroxide-simeth (MAALOX MAX) 400-400-40 MG/5ML suspension  Every 6 hours PRN        10/02/20 0755    pantoprazole (PROTONIX) 40 MG tablet  Daily        10/02/20 0756           Note:  This document was prepared using  [employer] and may include unintentional dictation errors.    Nita Sickle, MD 10/02/20 [phone]

## 2020-10-02 NOTE — ED Triage Notes (Addendum)
Pt arrived via EMS from home with c/o substernal chest pain since 2300 last night. Pt denies SOB, N/V. 324mg  Aspirin given by EMS.

## 2020-10-27 ENCOUNTER — Ambulatory Visit: Payer: Self-pay | Admitting: Physician Assistant

## 2020-10-27 ENCOUNTER — Encounter: Payer: Self-pay | Admitting: Physician Assistant

## 2020-10-27 ENCOUNTER — Other Ambulatory Visit: Payer: Self-pay

## 2020-10-27 DIAGNOSIS — Z113 Encounter for screening for infections with a predominantly sexual mode of transmission: Secondary | ICD-10-CM

## 2020-10-27 LAB — WET PREP FOR TRICH, YEAST, CLUE
Trichomonas Exam: NEGATIVE
Yeast Exam: NEGATIVE

## 2020-10-27 LAB — HIV ANTIBODY (ROUTINE TESTING W REFLEX): HIV 1&2 Ab, 4th Generation: NONREACTIVE

## 2020-10-27 NOTE — Progress Notes (Signed)
  [facility] Department STI clinic/screening visit  Subjective:  Traci Petersen is a 30 y.o. female being seen today for an STI screening visit. The patient reports they do not have symptoms.  Patient reports that they do not desire a pregnancy in the next year.   They reported they are not interested in discussing contraception today.  No LMP recorded.   Patient has the following medical conditions:   Patient Active Problem List   Diagnosis Date Noted  . Hypercholesterolemia 11/02/2019    Chief Complaint  Patient presents with  . SEXUALLY TRANSMITTED DISEASE    screening    HPI  Patient reports that she is not having any symptoms but would like a screening today.  Denies surgeries.  Reports new allergy to medicine for reflux and that she takes Xulane as BCM.  LMP 10/05/2020 and normal, last pap was in 2019 and last HIV test was 07/2020.  See flowsheet for further details and programmatic requirements.    The following portions of the patient's history were reviewed and updated as appropriate: allergies, current medications, past medical history, past social history, past surgical history and problem list.  Objective:  There were no vitals filed for this visit.  Physical Exam Constitutional:      General: She is not in acute distress.    Appearance: Normal appearance.  HENT:     Head: Normocephalic and atraumatic.  Eyes:     Conjunctiva/sclera: Conjunctivae normal.  Pulmonary:     Effort: Pulmonary effort is normal.  Skin:    General: Skin is warm and dry.  Neurological:     Mental Status: She is alert and oriented to person, place, and time.  Psychiatric:        Mood and Affect: Mood normal.        Behavior: Behavior normal.        Thought Content: Thought content normal.        Judgment: Judgment normal.      Assessment and Plan:  Salayah Meares is a 30 y.o. female presenting to the [facility] Department for STI screening  1.  Screening for STD (sexually transmitted disease) Patient into clinic without symptoms. Reviewed with patient wet mount results and that no treatment is indicated today.  Rec condoms with all sex. Await test results.  Counseled that RN will call if needs to RTC for treatment once results are back. - WET PREP FOR TRICH, YEAST, CLUE - Chlamydia/Gonorrhea [place] [facility] - HIV [place] [facility] - Syphilis Serology, [place] [facility]     No follow-ups on file.  Future Appointments  Date Time Provider Department Center  05/03/2021  2:30 PM Doreene Burke, CNM [facility] None    Marylynn Pearson [place], [place]

## 2020-11-02 NOTE — Progress Notes (Signed)
RN abstracted HIV results. Shakoya Gilmore, RN  

## 2020-11-16 ENCOUNTER — Other Ambulatory Visit: Payer: Self-pay | Admitting: Nurse Practitioner

## 2020-11-30 ENCOUNTER — Ambulatory Visit
Admission: RE | Admit: 2020-11-30 | Discharge: 2020-11-30 | Disposition: A | Discharge status: Home / Self Care | Payer: 59 | Source: Ambulatory Visit | Attending: Cardiovascular Disease | Admitting: Cardiovascular Disease

## 2020-11-30 ENCOUNTER — Other Ambulatory Visit: Payer: Self-pay

## 2020-11-30 DIAGNOSIS — E7849 Other hyperlipidemia: Secondary | ICD-10-CM | POA: Insufficient documentation

## 2020-12-14 ENCOUNTER — Other Ambulatory Visit: Payer: 59

## 2021-01-19 ENCOUNTER — Encounter: Payer: Self-pay | Admitting: Family Medicine

## 2021-01-19 ENCOUNTER — Ambulatory Visit: Payer: 59 | Admitting: Family Medicine

## 2021-01-19 ENCOUNTER — Other Ambulatory Visit: Payer: Self-pay

## 2021-01-19 DIAGNOSIS — Z113 Encounter for screening for infections with a predominantly sexual mode of transmission: Secondary | ICD-10-CM

## 2021-01-19 LAB — WET PREP FOR TRICH, YEAST, CLUE
Trichomonas Exam: NEGATIVE
Yeast Exam: NEGATIVE

## 2021-01-19 MED ORDER — METRONIDAZOLE 500 MG PO TABS: 500.0000 mg | ORAL_TABLET | Freq: Two times a day (BID) | ORAL | 0 refills | Status: AC

## 2021-01-19 NOTE — Progress Notes (Signed)
[facility] Department STI clinic/screening visit  Subjective:  Traci Petersen is a 30 y.o. female being seen today for an STI screening visit. The patient reports they do not have symptoms.  Patient reports that they do not desire a pregnancy in the next year.   They reported they are not interested in discussing contraception today.  Patient's last menstrual period was 01/10/2021.   Patient has the following medical conditions:   Patient Active Problem List   Diagnosis Date Noted   Hypercholesterolemia 11/02/2019    Chief Complaint  Patient presents with   SEXUALLY TRANSMITTED DISEASE    Screening    HPI  Patient reports her for screening, denies s/sx   Last HIV test per patient/review of record was 10/27/20 Patient reports last pap was 11/22/2017.   See flowsheet for further details and programmatic requirements.    The following portions of the patient's history were reviewed and updated as appropriate: allergies, current medications, past medical history, past social history, past surgical history and problem list.  Objective:  There were no vitals filed for this visit.  Physical Exam Vitals and nursing note reviewed.  Constitutional:      Appearance: Normal appearance.  HENT:     Head: Normocephalic and atraumatic.     Mouth/Throat:     Mouth: Mucous membranes are moist.     Pharynx: Oropharynx is clear. No oropharyngeal exudate or posterior oropharyngeal erythema.  Pulmonary:     Effort: Pulmonary effort is normal.  Chest:  Breasts:    Right: No axillary adenopathy or supraclavicular adenopathy.     Left: No axillary adenopathy or supraclavicular adenopathy.  Abdominal:     General: Abdomen is flat.     Palpations: There is no mass.     Tenderness: There is no abdominal tenderness. There is no rebound.  Genitourinary:    General: Normal vulva.     Exam position: Lithotomy position.     Pubic Area: No rash or pubic lice.      Labia:         Right: No rash or lesion.        Left: No rash or lesion.      Vagina: Vaginal discharge present. No erythema, bleeding or lesions.     Cervix: No cervical motion tenderness, discharge, friability, lesion or erythema.     Uterus: Normal.      Adnexa: Right adnexa normal and left adnexa normal.     Rectum: Normal.     Comments: External genitalia without, lice, nits, erythema, edema , lesions or inguinal adenopathy. Vagina with normal mucosa and yellowish  discharge and pH > 4.  Cervix without visual lesions but friable, uterus firm, mobile, non-tender, no masses, CMT adnexal fullness or tenderness. ;l Lymphadenopathy:     Head:     Right side of head: No preauricular or posterior auricular adenopathy.     Left side of head: No preauricular or posterior auricular adenopathy.     Cervical: No cervical adenopathy.     Upper Body:     Right upper body: No supraclavicular or axillary adenopathy.     Left upper body: No supraclavicular or axillary adenopathy.     Lower Body: No right inguinal adenopathy. No left inguinal adenopathy.  Skin:    General: Skin is warm and dry.     Findings: No rash.  Neurological:     Mental Status: She is alert and oriented to person, place, and time.    Assessment and  Plan:  Traci Petersen is a 30 y.o. female presenting to the [facility] Department for STI screening  1. Screening examination for venereal disease  - Chlamydia/Gonorrhea [place] [facility] - Syphilis Serology, [place] [facility] - HIV  LAB - WET PREP FOR TRICH, YEAST, CLUE  Patient accepted all screenings including  wet prep,  vaginal CT/GC and bloodwork for HIV/RPR.  Patient meets criteria for HepB screening? No. Ordered? No - does not meet criteria  Patient meets criteria for HepC screening? No. Ordered? No - does not meet criteria   Wet prep results - +amine , + clue   Treatment needed for BV   RN Treat Pt for BV with Metronidazole 500 mg PO BID x 7 days   Discussed time  line for [facility] results and that patient will be called with positive results and encouraged patient to call if she had not heard in 2 weeks.  Counseled to return or seek care for continued or worsening symptoms Recommended condom use with all sex  Patient is currently has patches but does not used them  to prevent pregnancy.    Return for as needed.  Future Appointments  Date Time Provider Department Center  05/03/2021  2:30 PM Doreene Burke, CNM [facility] None    Wendi Snipes, FNP

## 2021-01-19 NOTE — Progress Notes (Signed)
Pt here for STD screening.  Wet mount results reviewed.  Medication dispensed per Provider orders. Pt declined condoms. Dahl Higinbotham M Masayo Fera, RN  

## 2021-04-13 ENCOUNTER — Other Ambulatory Visit: Payer: Self-pay

## 2021-04-13 ENCOUNTER — Ambulatory Visit: Payer: Self-pay | Admitting: Family Medicine

## 2021-04-13 ENCOUNTER — Encounter: Payer: Self-pay | Admitting: Family Medicine

## 2021-04-13 DIAGNOSIS — Z113 Encounter for screening for infections with a predominantly sexual mode of transmission: Secondary | ICD-10-CM

## 2021-04-13 LAB — WET PREP FOR TRICH, YEAST, CLUE
Trichomonas Exam: NEGATIVE
Yeast Exam: NEGATIVE

## 2021-04-13 NOTE — Progress Notes (Signed)
[facility] Department STI clinic/screening visit  Subjective:  Traci Petersen is a 30 y.o. female being seen today for an STI screening visit. The patient reports they do not have symptoms.  Patient reports that they do not desire a pregnancy in the next year.   They reported they are not interested in discussing contraception today.  Patient's last menstrual period was 04/02/2021.   Patient has the following medical conditions:   Patient Active Problem List   Diagnosis Date Noted   Hypercholesterolemia 11/02/2019    Chief Complaint  Patient presents with   SEXUALLY TRANSMITTED DISEASE    Screening     HPI  Patient reports here for screening, denies s/sx   Last HIV test per patient/review of record was 01/19/2021 Patient reports last pap was 11/22/2017.   See flowsheet for further details and programmatic requirements.    The following portions of the patient's history were reviewed and updated as appropriate: allergies, current medications, past medical history, past social history, past surgical history and problem list.  Objective:  There were no vitals filed for this visit.  Physical Exam Vitals and nursing note reviewed.  Constitutional:      Appearance: Normal appearance.  HENT:     Head: Normocephalic and atraumatic.     Mouth/Throat:     Mouth: Mucous membranes are moist.     Pharynx: Oropharynx is clear. No oropharyngeal exudate or posterior oropharyngeal erythema.  Pulmonary:     Effort: Pulmonary effort is normal.  Abdominal:     General: Abdomen is flat.     Palpations: There is no mass.     Tenderness: There is no abdominal tenderness. There is no rebound.  Genitourinary:    Exam position: Lithotomy position.     Pubic Area: No rash or pubic lice.      Labia:        Right: No rash or lesion.        Left: No rash or lesion.      Vagina: Normal. No vaginal discharge, erythema, bleeding or lesions.     Cervix: No cervical motion  tenderness, discharge, friability, lesion or erythema.     Uterus: Normal.      Adnexa: Right adnexa normal and left adnexa normal.     Comments: Deferred, pt self collected  Musculoskeletal:     Cervical back: Normal range of motion and neck supple.  Lymphadenopathy:     Head:     Right side of head: No preauricular or posterior auricular adenopathy.     Left side of head: No preauricular or posterior auricular adenopathy.     Cervical: No cervical adenopathy.     Upper Body:     Right upper body: No supraclavicular or axillary adenopathy.     Left upper body: No supraclavicular or axillary adenopathy.     Lower Body: No right inguinal adenopathy. No left inguinal adenopathy.  Skin:    General: Skin is warm and dry.     Findings: No rash.  Neurological:     Mental Status: She is alert and oriented to person, place, and time.  Psychiatric:        Mood and Affect: Mood normal.        Behavior: Behavior normal.     Assessment and Plan:  Traci Petersen is a 30 y.o. female presenting to the [facility] Department for STI screening  1. Screening examination for venereal disease Patient accepted all screenings including wet prep,  vaginal  CT/GC and bloodwork for HIV/RPR.  Patient meets criteria for HepB screening? No. Ordered? No - does not meet criteria  Patient meets criteria for HepC screening? No. Ordered? No - does not meet criteria   Wet prep results neg    No Treatment needed  Discussed time line for State Lab results and that patient will be called with positive results and encouraged patient to call if she had not heard in 2 weeks.  Counseled to return or seek care for continued or worsening symptoms Recommended condom use with all sex  Patient is currently using  the patch   to prevent pregnancy.  Patient interested in changing bcm.  Brochure given.   - Chlamydia/Gonorrhea [place] [facility] - HIV [place] [facility] - WET PREP FOR TRICH, YEAST, CLUE - Syphilis  Serology, [place] [facility]  Return for as needed.  Future Appointments  Date Time Provider Department Center  05/03/2021  2:30 PM Doreene Burke, CNM [facility] None    Wendi Snipes, FNP

## 2021-05-02 ENCOUNTER — Ambulatory Visit: Payer: 59

## 2021-05-03 ENCOUNTER — Ambulatory Visit (INDEPENDENT_AMBULATORY_CARE_PROVIDER_SITE_OTHER): Payer: 59 | Admitting: Certified Nurse Midwife

## 2021-05-03 ENCOUNTER — Encounter: Payer: Self-pay | Admitting: Certified Nurse Midwife

## 2021-05-03 ENCOUNTER — Other Ambulatory Visit (HOSPITAL_COMMUNITY)
Admission: RE | Admit: 2021-05-03 | Discharge: 2021-05-03 | Disposition: A | Discharge status: Home / Self Care | Payer: 59 | Source: Ambulatory Visit | Attending: Certified Nurse Midwife | Admitting: Certified Nurse Midwife

## 2021-05-03 ENCOUNTER — Other Ambulatory Visit: Payer: Self-pay

## 2021-05-03 VITALS — BP 118/74 | HR 60 | Ht 66.0 in | Wt 176.2 lb

## 2021-05-03 DIAGNOSIS — N898 Other specified noninflammatory disorders of vagina: Secondary | ICD-10-CM | POA: Insufficient documentation

## 2021-05-03 DIAGNOSIS — Z124 Encounter for screening for malignant neoplasm of cervix: Secondary | ICD-10-CM

## 2021-05-03 DIAGNOSIS — Z01419 Encounter for gynecological examination (general) (routine) without abnormal findings: Secondary | ICD-10-CM | POA: Insufficient documentation

## 2021-05-03 DIAGNOSIS — Z309 Encounter for contraceptive management, unspecified: Secondary | ICD-10-CM | POA: Diagnosis not present

## 2021-05-03 LAB — POCT URINE PREGNANCY: Preg Test, Ur: NEGATIVE

## 2021-05-03 MED ORDER — NORELGESTROMIN-ETH ESTRADIOL 150-35 MCG/24HR TD PTWK: 1.0000 | MEDICATED_PATCH | TRANSDERMAL | 12 refills | Status: DC

## 2021-05-03 NOTE — Progress Notes (Signed)
GYNECOLOGY ANNUAL PREVENTATIVE CARE ENCOUNTER NOTE  History:     Traci Petersen is a 30 y.o. G29P2002 female here for a routine annual gynecologic exam.  Current complaints: increased vaginal discharge.   Denies abnormal vaginal bleeding, discharge, pelvic pain, problems with intercourse or other gynecologic concerns.     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 Contraception: OCP (estrogen/progesterone) Last Pap: 11/2017. Results were: normal  Last mammogram: n/a.     Upstream - 05/03/21 1435       Pregnancy Intention Screening   Does the patient want to become pregnant in the next year? No    Does the patient's partner want to become pregnant in the next year? Yes            The pregnancy intention screening data noted above was reviewed. Potential methods of contraception were discussed. The patient elected to proceed with patch.   Obstetric History OB History  Gravida Para Term Preterm AB Living  2 2 2     2   SAB IAB Ectopic Multiple Live Births        0 2    # Outcome Date GA Lbr Len/2nd Weight Sex Delivery Anes PTL Lv  2 Term 01/06/15 [redacted]w[redacted]d 06:37 / 00:20 6 lb 6.8 oz (2.914 kg) F Vag-Spont EPI  LIV  1 Term 10/20/12   6 lb 5 oz (2.863 kg) F Vag-Spont  N LIV    Past Medical History:  Diagnosis Date   Bunion    Chlamydia    Vaginal Pap smear, abnormal     Past Surgical History:  Procedure Laterality Date   FOOT SURGERY      Current Outpatient Medications on File Prior to Visit  Medication Sig Dispense Refill   atorvastatin (LIPITOR) 40 MG tablet Take 40 mg by mouth daily.     ketoconazole (NIZORAL) 2 % shampoo LATHER ON TO FACE FOR 3 5 MINUTES, THEN RINSE OFF. DO THIS THREE TIMES WEEKLY IF NEEDED FOR FLARES.     pantoprazole (PROTONIX) 40 MG tablet  Take 1 tablet (40 mg total) by mouth daily. 30 tablet 1   rosuvastatin (CRESTOR) 40 MG tablet Take by mouth.     No current facility-administered medications on file prior to visit.    Allergies  Allergen Reactions   Pantoprazole Swelling and Hives    Also had facial swelling, throat swelling, and hand swelling    Social History:  reports that she has never smoked. She has never used smokeless tobacco. She reports that she does not currently use alcohol after a past usage of about 1.0 standard drink per week. She reports that she does not use drugs.  Family History  Problem Relation Age of Onset   Heart disease Father    Hypertension Father    Alcohol abuse Maternal Grandfather    Breast cancer Paternal Grandmother    Hypertension Paternal Grandmother    Alcohol abuse Paternal Grandfather    Heart murmur Daughter    Down syndrome Daughter     The following portions of the patient's history were reviewed and updated as appropriate: allergies, current medications, past family history, past medical history, past social history, past surgical history and problem list.  Review of Systems Pertinent items noted in HPI and remainder of comprehensive ROS otherwise negative.  Physical Exam:  BP 118/74   Pulse 60   Ht 5\' 6"  (1.676 m)   Wt 176 lb 3.2 oz (79.9 kg)   LMP 04/20/2021 (Exact Date)   BMI 28.44 kg/m  CONSTITUTIONAL: Well-developed, well-nourished female in no acute distress.  HENT:  Normocephalic, atraumatic, External right and left ear normal. Oropharynx is clear and moist EYES: Conjunctivae and EOM are normal. Pupils are equal, round, and reactive to light. No scleral icterus.  NECK: Normal range of motion, supple, no masses.  Normal thyroid.  SKIN: Skin is warm and dry. No rash noted. Not diaphoretic. No erythema. No pallor. MUSCULOSKELETAL: Normal range of motion. No tenderness.  No cyanosis, clubbing, or edema.  2+ distal pulses. NEUROLOGIC: Alert and oriented to  person, place, and time. Normal reflexes, muscle tone coordination.  PSYCHIATRIC: Normal mood and affect. Normal behavior. Normal judgment and thought content. CARDIOVASCULAR: Normal heart rate noted, regular rhythm RESPIRATORY: Clear to auscultation bilaterally. Effort and breath sounds normal, no problems with respiration noted. BREASTS: Symmetric in size. No masses, tenderness, skin changes, nipple drainage, or lymphadenopathy bilaterally.  ABDOMEN: Soft, no distention noted.  No tenderness, rebound or guarding.  PELVIC: Normal appearing external genitalia and urethral meatus; normal appearing vaginal mucosa and cervix.  discharge noted, white with mild odor. Pap smear obtained.  Contact bleeding Normal uterine size, no other palpable masses, no uterine or adnexal tenderness.  .   Assessment and Plan:    1. Women's annual routine gynecological examination  Pap:Will follow up results of pap smear and manage accordingly. Mammogram : n/a  Labs:none Refills: BC- patch  Referral: none Routine preventative health maintenance measures emphasized. Please refer to After Visit Summary for other counseling recommendations.      06/20/2021, CNM [facility],  [facility]

## 2021-05-03 NOTE — Progress Notes (Signed)
Pt declines flu 

## 2021-05-03 NOTE — Patient Instructions (Signed)
Preventive Care 21-31 Years Old, Female Preventive care refers to lifestyle choices and visits with your health care provider that can promote health and wellness. This includes: A yearly physical exam. This is also called an annual wellness visit. Regular dental and eye exams. Immunizations. Screening for certain conditions. Healthy lifestyle choices, such as: Eating a healthy diet. Getting regular exercise. Not using drugs or products that contain nicotine and tobacco. Limiting alcohol use. What can I expect for my preventive care visit? Physical exam Your health care provider may check your: Height and weight. These may be used to calculate your BMI (body mass index). BMI is a measurement that tells if you are at a healthy weight. Heart rate and blood pressure. Body temperature. Skin for abnormal spots. Counseling Your health care provider may ask you questions about your: Past medical problems. Family's medical history. Alcohol, tobacco, and drug use. Emotional well-being. Home life and relationship well-being. Sexual activity. Diet, exercise, and sleep habits. Work and work environment. Access to firearms. Method of birth control. Menstrual cycle. Pregnancy history. What immunizations do I need? Vaccines are usually given at various ages, according to a schedule. Your health care provider will recommend vaccines for you based on your age, medical history, and lifestyle or other factors, such as travel or where you work. What tests do I need? Blood tests Lipid and cholesterol levels. These may be checked every 5 years starting at age 20. Hepatitis C test. Hepatitis B test. Screening Diabetes screening. This is done by checking your blood sugar (glucose) after you have not eaten for a while (fasting). STD (sexually transmitted disease) testing, if you are at risk. BRCA-related cancer screening. This may be done if you have a family history of breast, ovarian, tubal, or  peritoneal cancers. Pelvic exam and Pap test. This may be done every 3 years starting at age 21. Starting at age 30, this may be done every 5 years if you have a Pap test in combination with an HPV test. Talk with your health care provider about your test results, treatment options, and if necessary, the need for more tests. Follow these instructions at home: Eating and drinking  Eat a healthy diet that includes fresh fruits and vegetables, whole grains, lean protein, and low-fat dairy products. Take vitamin and mineral supplements as recommended by your health care provider. Do not drink alcohol if: Your health care provider tells you not to drink. You are pregnant, may be pregnant, or are planning to become pregnant. If you drink alcohol: Limit how much you have to 0-1 drink a day. Be aware of how much alcohol is in your drink. In the [place] one drink equals one 12 oz bottle of beer (355 mL), one 5 oz glass of wine (148 mL), or one 1 oz glass of hard liquor (44 mL). Lifestyle Take daily care of your teeth and gums. Brush your teeth every morning and night with fluoride toothpaste. Floss one time each day. Stay active. Exercise for at least 30 minutes 5 or more days each week. Do not use any products that contain nicotine or tobacco, such as cigarettes, e-cigarettes, and chewing tobacco. If you need help quitting, ask your health care provider. Do not use drugs. If you are sexually active, practice safe sex. Use a condom or other form of protection to prevent STIs (sexually transmitted infections). If you do not wish to become pregnant, use a form of birth control. If you plan to become pregnant, see your health care provider   for a prepregnancy visit. Find healthy ways to cope with stress, such as: Meditation, yoga, or listening to music. Journaling. Talking to a trusted person. Spending time with friends and family. Safety Always wear your seat belt while driving or riding in a  vehicle. Do not drive: If you have been drinking alcohol. Do not ride with someone who has been drinking. When you are tired or distracted. While texting. Wear a helmet and other protective equipment during sports activities. If you have firearms in your house, make sure you follow all gun safety procedures. Seek help if you have been physically or sexually abused. What's next? Go to your health care provider once a year for an annual wellness visit. Ask your health care provider how often you should have your eyes and teeth checked. Stay up to date on all vaccines. This information is not intended to replace advice given to you by your health care provider. Make sure you discuss any questions you have with your health care provider. Document Revised: 10/07/2020 Document Reviewed: 04/10/2018 Elsevier Patient Education  2022 Elsevier Inc.  

## 2021-05-08 ENCOUNTER — Other Ambulatory Visit: Payer: Self-pay | Admitting: Certified Nurse Midwife

## 2021-05-08 LAB — CERVICOVAGINAL ANCILLARY ONLY
Bacterial Vaginitis (gardnerella): POSITIVE — AB
Candida Glabrata: NEGATIVE
Candida Vaginitis: NEGATIVE
Chlamydia: NEGATIVE
Comment: NEGATIVE
Comment: NEGATIVE
Comment: NEGATIVE
Comment: NEGATIVE
Comment: NEGATIVE
Comment: NORMAL
Neisseria Gonorrhea: NEGATIVE
Trichomonas: NEGATIVE

## 2021-05-08 MED ORDER — METRONIDAZOLE 500 MG PO TABS: 500.0000 mg | ORAL_TABLET | Freq: Two times a day (BID) | ORAL | 0 refills | Status: AC

## 2021-05-11 LAB — CYTOLOGY - PAP
Comment: NEGATIVE
Comment: NEGATIVE
Diagnosis: NEGATIVE
HPV 16: NEGATIVE
HPV 18 / 45: NEGATIVE
High risk HPV: POSITIVE — AB

## 2021-08-11 ENCOUNTER — Ambulatory Visit: Payer: 59 | Admitting: Advanced Practice Midwife

## 2021-08-11 ENCOUNTER — Other Ambulatory Visit: Payer: Self-pay

## 2021-08-11 ENCOUNTER — Encounter: Payer: Self-pay | Admitting: Advanced Practice Midwife

## 2021-08-11 DIAGNOSIS — R87618 Other abnormal cytological findings on specimens from cervix uteri: Secondary | ICD-10-CM

## 2021-08-11 DIAGNOSIS — Z30013 Encounter for initial prescription of injectable contraceptive: Secondary | ICD-10-CM

## 2021-08-11 DIAGNOSIS — Z113 Encounter for screening for infections with a predominantly sexual mode of transmission: Secondary | ICD-10-CM

## 2021-08-11 DIAGNOSIS — R87619 Unspecified abnormal cytological findings in specimens from cervix uteri: Secondary | ICD-10-CM | POA: Insufficient documentation

## 2021-08-11 LAB — WET PREP FOR TRICH, YEAST, CLUE
Trichomonas Exam: NEGATIVE
Yeast Exam: NEGATIVE

## 2021-08-11 LAB — PREGNANCY, URINE: Preg Test, Ur: NEGATIVE

## 2021-08-11 MED ORDER — MEDROXYPROGESTERONE ACETATE 150 MG/ML IM SUSP: 150.0000 mg | Freq: Once | INTRAMUSCULAR | Status: AC

## 2021-08-11 NOTE — Progress Notes (Signed)
[facility] Department  STI clinic/screening visit [address] [place] [address] [phone]  Subjective:  Traci Petersen is a 30 y.o. SBF nonsmoker G2P2 female being seen today for an STI screening visit. The patient reports they do not have symptoms.  Patient reports that they do not desire a pregnancy in the next year.   They reported they are interested in discussing contraception today.    No LMP recorded.   Patient has the following medical conditions:   Patient Active Problem List   Diagnosis Date Noted   Abnormal Pap smear of cervix 05/03/21 neg HPV+ 08/11/2021   Hypercholesterolemia 11/02/2019    Chief Complaint  Patient presents with   SEXUALLY TRANSMITTED DISEASE    HPI  Patient reports asymptomatic and wants DMPA. Last sex 07/21/21 without condom; with current partner x 1 year; 1 sex partner in last 3 mo. LMP 07/18/21. Last physical 05/03/21. Last ETOH 08/06/21 (1 wine cooler) qo month.   Last HIV test per patient/review of record was 04/13/21 Patient reports last pap was 05/03/21 neg HPV +  Screening for MPX risk: Does the patient have an unexplained rash? No Is the patient MSM? No Does the patient endorse multiple sex partners or anonymous sex partners? No Did the patient have close or sexual contact with a person diagnosed with MPX? No Has the patient traveled outside the [place] where MPX is endemic? No Is there a high clinical suspicion for MPX-- evidenced by one of the following No  -Unlikely to be chickenpox  -Lymphadenopathy  -Rash that present in same phase of evolution on any given body part See flowsheet for further details and programmatic requirements.    The following portions of the patient's history were reviewed and updated as appropriate: allergies, current medications, past medical history, past social history, past surgical history and problem list.  Objective:  There were no vitals filed for this visit.  Physical  Exam Vitals and nursing note reviewed.  Constitutional:      Appearance: Normal appearance.  HENT:     Head: Normocephalic and atraumatic.     Mouth/Throat:     Mouth: Mucous membranes are moist.     Pharynx: Oropharynx is clear. No oropharyngeal exudate or posterior oropharyngeal erythema.  Eyes:     Conjunctiva/sclera: Conjunctivae normal.  Pulmonary:     Effort: Pulmonary effort is normal.  Abdominal:     Palpations: Abdomen is soft. There is no mass.     Tenderness: There is no abdominal tenderness. There is no rebound.     Comments: Soft without masses or tenderness, fair tone  Genitourinary:    General: Normal vulva.     Exam position: Lithotomy position.     Pubic Area: No rash or pubic lice.      Labia:        Right: No rash or lesion.        Left: No rash or lesion.      Vagina: Vaginal discharge (light brown thin leukorrhea, ph>4.5) present. No erythema, bleeding or lesions.     Cervix: Normal.     Uterus: Normal.      Adnexa: Right adnexa normal and left adnexa normal.     Rectum: Normal.  Lymphadenopathy:     Head:     Right side of head: No preauricular or posterior auricular adenopathy.     Left side of head: No preauricular or posterior auricular adenopathy.     Cervical: No cervical adenopathy.  Right cervical: No superficial, deep or posterior cervical adenopathy.    Left cervical: No superficial, deep or posterior cervical adenopathy.     Upper Body:     Right upper body: No supraclavicular or axillary adenopathy.     Left upper body: No supraclavicular or axillary adenopathy.     Lower Body: No right inguinal adenopathy. No left inguinal adenopathy.  Skin:    General: Skin is warm and dry.     Findings: No rash.  Neurological:     Mental Status: She is alert and oriented to person, place, and time.     Assessment and Plan:  Traci Petersen is a 30 y.o. female presenting to the [facility] Department for STI screening  1. Screening  examination for venereal disease Treat wet mount per standing orders Immunization nurse consult - WET PREP FOR TRICH, YEAST, CLUE - Pregnancy, urine - Chlamydia/Gonorrhea [place] [facility] - HIV [place] [facility] - Syphilis Serology, [place] [facility]  2. Other abnormal cytological finding of specimen from cervix 05/03/21 neg HPV +  3. Encounter for initial prescription of injectable contraceptive If PT neg today may have DMPA 150 mg IM x1 Please counsel on need for abstinance next 7 days Will need yearly physical within last  year in order to receive more DMPA - medroxyPROGESTERone (DEPO-PROVERA) injection 150 mg     No follow-ups on file.  Future Appointments  Date Time Provider Department Center  05/08/2022  2:30 PM Doreene Burke, CNM [facility] None    Alberteen Spindle, CNM

## 2021-08-29 ENCOUNTER — Ambulatory Visit: Payer: 59

## 2021-10-18 ENCOUNTER — Ambulatory Visit: Payer: 59

## 2021-10-20 ENCOUNTER — Encounter: Payer: Self-pay | Admitting: Family Medicine

## 2021-10-20 ENCOUNTER — Ambulatory Visit: Payer: Self-pay | Admitting: Family Medicine

## 2021-10-20 ENCOUNTER — Other Ambulatory Visit: Payer: Self-pay

## 2021-10-20 DIAGNOSIS — Z113 Encounter for screening for infections with a predominantly sexual mode of transmission: Secondary | ICD-10-CM

## 2021-10-20 LAB — WET PREP FOR TRICH, YEAST, CLUE
Trichomonas Exam: NEGATIVE
Yeast Exam: NEGATIVE

## 2021-10-20 LAB — HM HIV SCREENING LAB: HM HIV Screening: NEGATIVE

## 2021-10-20 NOTE — Progress Notes (Signed)
Patient seen today for STD clinic. Wet prep reviewed. No tx per standing orders. Condoms declined.  ?

## 2021-10-22 NOTE — Progress Notes (Signed)
[facility] Department ? ?STI clinic/screening visit ?[address] ?[place] [address] ?[phone] ? ?Subjective:  ?Traci Petersen is a 31 y.o. female being seen today for an STI screening visit. The patient reports they do not have symptoms.  Patient reports that they do not desire a pregnancy in the next year.   They reported they are not interested in discussing contraception today.   ? ?Patient's last menstrual period was 10/03/2021 (approximate). ? ? ?Patient has the following medical conditions:   ?Patient Active Problem List  ? Diagnosis Date Noted  ? Abnormal Pap smear of cervix 05/03/21 neg HPV+ 08/11/2021  ? Hypercholesterolemia 11/02/2019  ? ? ?Chief Complaint  ?Patient presents with  ? SEXUALLY TRANSMITTED DISEASE  ? ? ?HPI ? ?Patient reports here for screening, denies s/sx ? ?Last HIV test per patient/review of record was 08/11/2021 ?Patient reports last pap was 05/03/2021.  ? ?Screening for MPX risk: ?Does the patient have an unexplained rash? No ?Is the patient MSM? No ?Does the patient endorse multiple sex partners or anonymous sex partners? No ?Did the patient have close or sexual contact with a person diagnosed with MPX? No ?Has the patient traveled outside the [place] where MPX is endemic? No ?Is there a high clinical suspicion for MPX-- evidenced by one of the following No ? -Unlikely to be chickenpox ? -Lymphadenopathy ? -Rash that present in same phase of evolution on any given body part ?See flowsheet for further details and programmatic requirements.  ? ? ?The following portions of the patient's history were reviewed and updated as appropriate: allergies, current medications, past medical history, past social history, past surgical history and problem list. ? ?Objective:  ?There were no vitals filed for this visit. ? ?Physical Exam ?Vitals and nursing note reviewed.  ?Constitutional:   ?   Appearance: Normal appearance.  ?HENT:  ?   Head: Normocephalic and atraumatic.  ?    Mouth/Throat:  ?   Mouth: Mucous membranes are moist.  ?   Pharynx: Oropharynx is clear. No oropharyngeal exudate or posterior oropharyngeal erythema.  ?Pulmonary:  ?   Effort: Pulmonary effort is normal.  ?Abdominal:  ?   General: Abdomen is flat.  ?   Palpations: There is no mass.  ?   Tenderness: There is no abdominal tenderness. There is no rebound.  ?Genitourinary: ?   Exam position: Lithotomy position.  ?   Pubic Area: No rash or pubic lice.   ?   Labia:     ?   Right: No rash or lesion.     ?   Left: No rash or lesion.   ?   Vagina: Normal. No vaginal discharge, erythema, bleeding or lesions.  ?   Cervix: No cervical motion tenderness, discharge, friability, lesion or erythema.  ?   Uterus: Normal.   ?   Adnexa: Right adnexa normal and left adnexa normal.  ?   Comments: Deferred pt self collected  ?Lymphadenopathy:  ?   Head:  ?   Right side of head: No preauricular or posterior auricular adenopathy.  ?   Left side of head: No preauricular or posterior auricular adenopathy.  ?   Cervical: No cervical adenopathy.  ?   Upper Body:  ?   Right upper body: No supraclavicular or axillary adenopathy.  ?   Left upper body: No supraclavicular or axillary adenopathy.  ?   Lower Body: No right inguinal adenopathy. No left inguinal adenopathy.  ?Skin: ?   General:  Skin is warm and dry.  ?   Findings: No rash.  ?Neurological:  ?   Mental Status: She is alert and oriented to person, place, and time.  ?Psychiatric:     ?   Mood and Affect: Mood normal.     ?   Behavior: Behavior normal.  ? ? ? ?Assessment and Plan:  ?Traci Petersen is a 31 y.o. female presenting to the [facility] Department for STI screening ? ?1. Screening examination for venereal disease ?Patient accepted all screenings including wet prep, vaginal CT/GC and bloodwork for HIV/RPR.  ?Patient meets criteria for HepB screening? No. Ordered? No -   ?Patient meets criteria for HepC screening? No. Ordered? No -   ? ?Wet prep results neg    ?No  Treatment needed  ?Discussed time line for State Lab results and that patient will be called with positive results and encouraged patient to call if she had not heard in 2 weeks.  ?Counseled to return or seek care for continued or worsening symptoms ?Recommended condom use with all sex ? ?Patient is currently using  no BCM  to prevent pregnancy.   ?- Chlamydia/Gonorrhea [place] [facility] ?- HIV [place] [facility] ?- WET PREP FOR TRICH, YEAST, CLUE ?- Syphilis Serology, [place] [facility] ? ? ? ? ?No follow-ups on file. ? ?Future Appointments  ?Date Time Provider Department Center  ?05/08/2022  2:30 PM Doreene Burke, CNM [facility] None  ? ? ?Wendi Snipes, FNP ? ?

## 2022-01-17 DIAGNOSIS — L282 Other prurigo: Secondary | ICD-10-CM | POA: Diagnosis not present

## 2022-02-07 IMAGING — CT CT CARDIAC CORONARY ARTERY CALCIUM SCORE
3 series · 14 of 20 positions shown, 16 images · non-contrast
Comparison: None.
COMPARISON: None.

Addendum:
EXAM:
OVER-READ INTERPRETATION  CT CHEST

The following report is an over-read performed by radiologist Dr.
Shaheed Lashae [REDACTED] on 11/30/2020. This
over-read does not include interpretation of cardiac or coronary
anatomy or pathology. The coronary calcium score interpretation by
the cardiologist is attached.
CLINICAL DATA: Risk stratification
Coronary Calcium Score
TECHNIQUE: The patient was scanned on a Siemens go.Top Scanner. Axial
non-contrast 3 mm slices were carried out through the heart. The
data set was analyzed on a dedicated work station and scored using
the Agatson method.

[Series 2: sa36 calcium scoring 3.00 · axial · 0.30mm/px · z∈[-1092,-1011]mm · 4 of 45 slices shown]
[im 9/45  vessel]
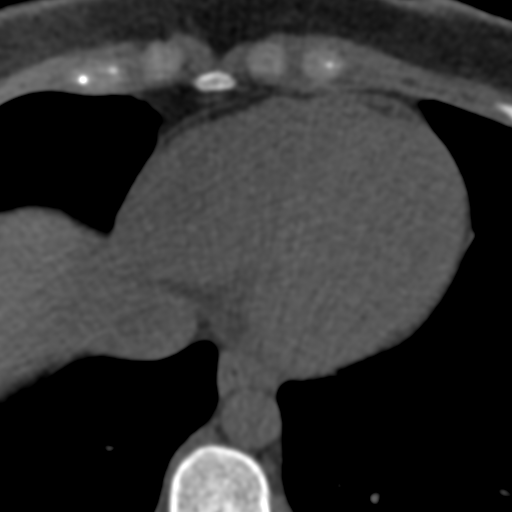
[im 18/45  vessel]
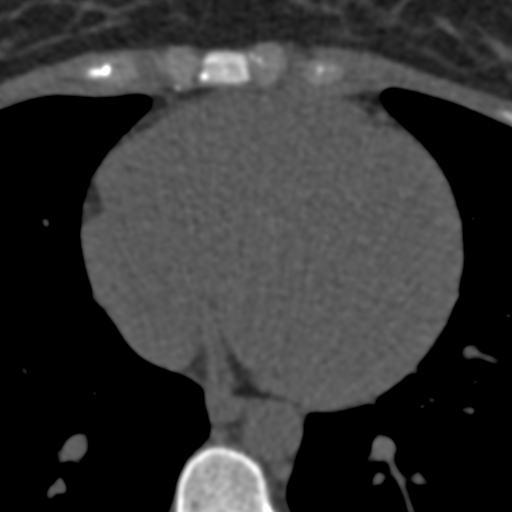
[im 27/45  vessel]
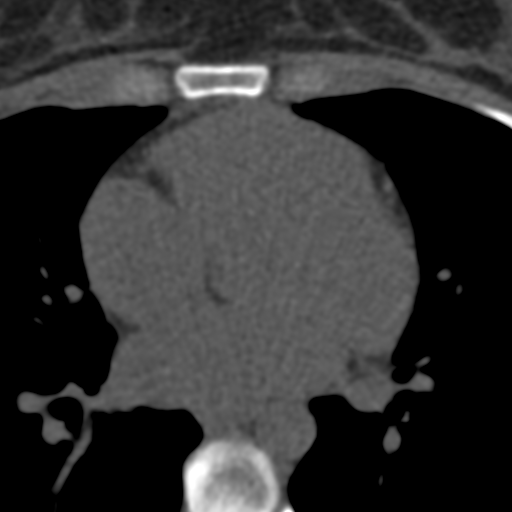
[im 36/45  vessel]
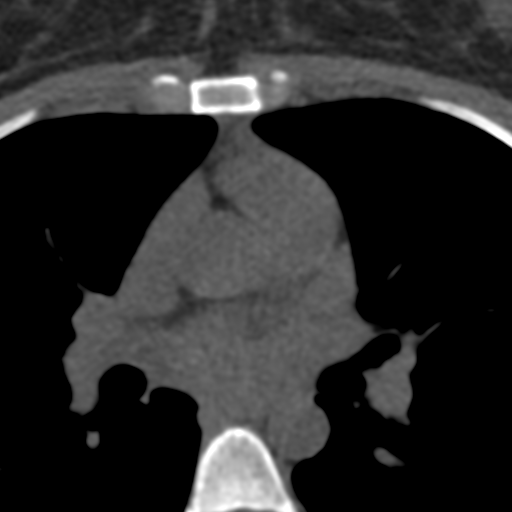

[Series 5: full fov st calcium scoring 3.00 · axial · 0.62mm/px · z∈[-1095,-1008]mm · 5 of 45 slices shown, 7 images]
[im 8/45  vessel]
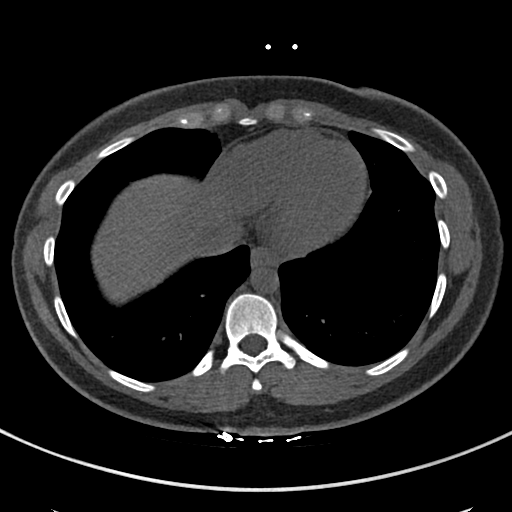
[im 8/45  lung]
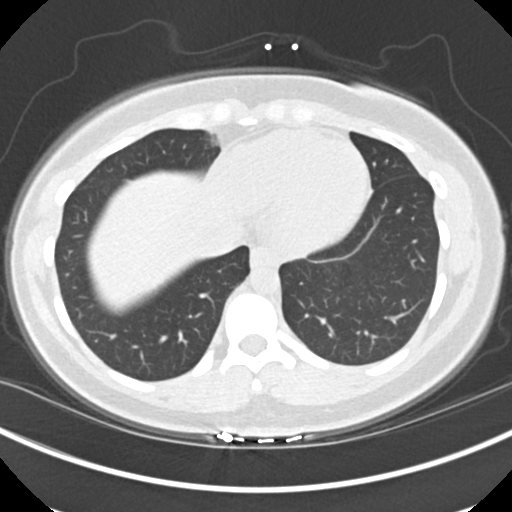
[im 15/45  vessel]
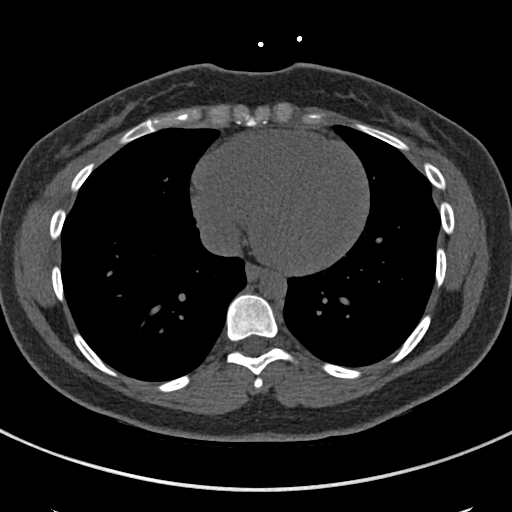
[im 23/45  vessel]
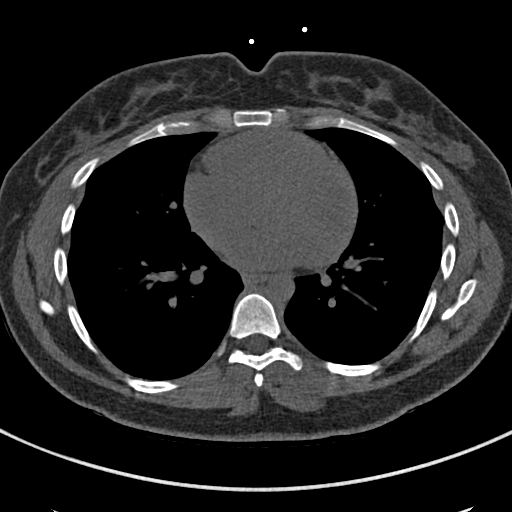
[im 30/45  vessel]
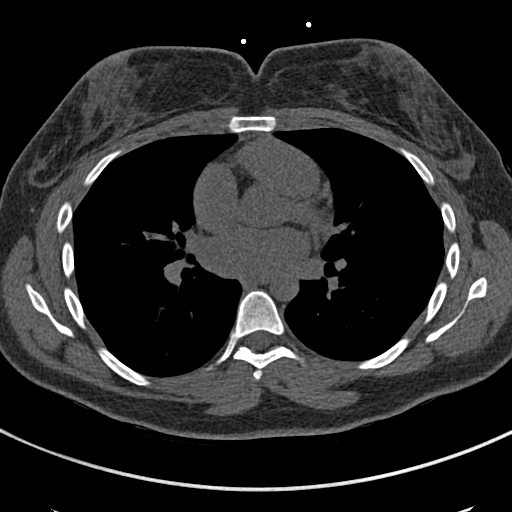
[im 37/45  vessel]
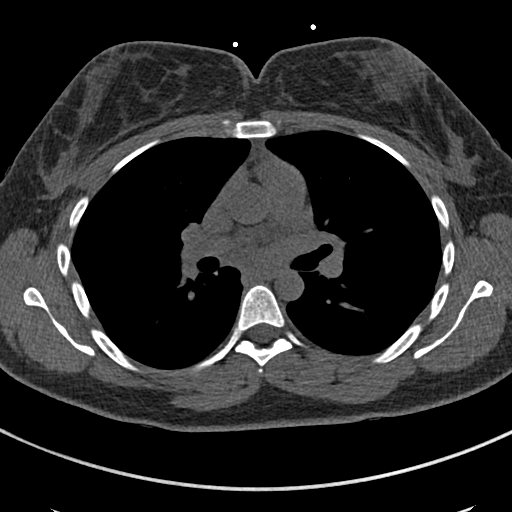
[im 37/45  lung]
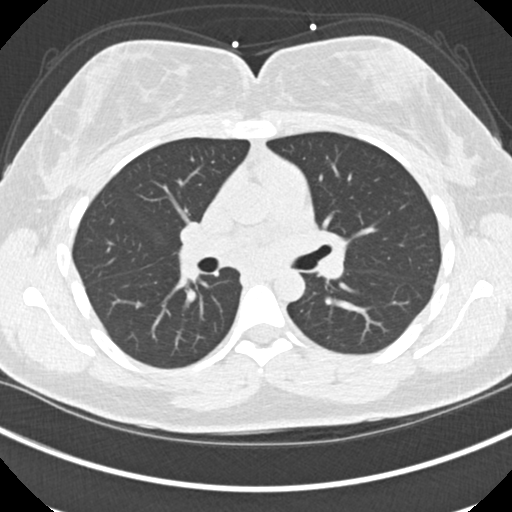

[Series 10: full fov lungs calcium scoring 3.00 ax · axial · 0.62mm/px · z∈[-1095,-1008]mm · 5 of 45 slices shown]
[im 8/45  vessel]
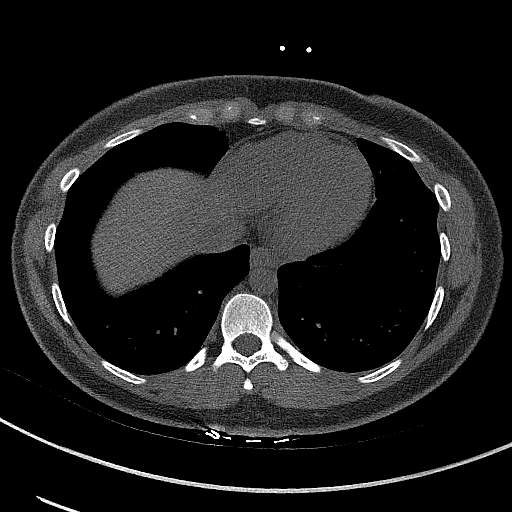
[im 15/45  vessel]
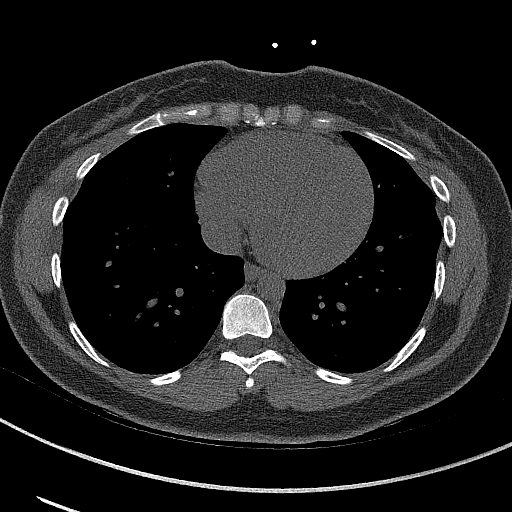
[im 23/45  vessel]
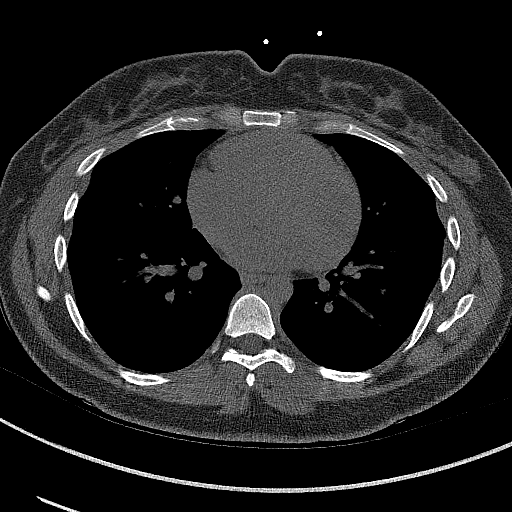
[im 30/45  vessel]
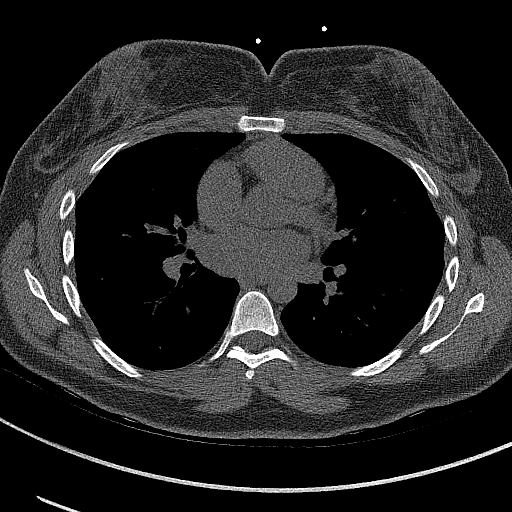
[im 37/45  vessel]
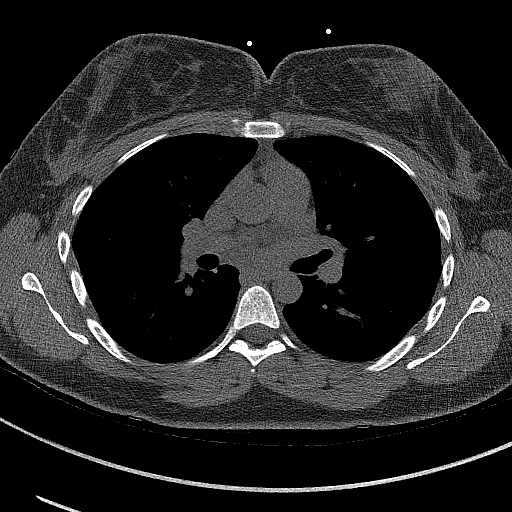

[14 of 20 positions shown; findings below may reference images not displayed]

FINDINGS: Within the visualized portions of the thorax there are no suspicious
appearing pulmonary nodules or masses, there is no acute
consolidative airspace disease, no pleural effusions, no
pneumothorax and no lymphadenopathy. Visualized portions of the
upper abdomen are unremarkable. There are no aggressive appearing
lytic or blastic lesions noted in the visualized portions of the
skeleton.
IMPRESSION: 1. No significant incidental noncardiac findings are noted.
FINDINGS: Non-cardiac: See separate report from [REDACTED].

Ascending Aorta: Normal size

Pericardium: Normal

Coronary arteries: Normal origin of left and right coronary
arteries. Distribution of arterial calcifications if present, as
noted below;

LM 0

LAD 0

LCx 0

RCA 0

Total 0
IMPRESSION: Normal coronary calcium score of 0. Patient is low risk for coronary
events.

Sravani Fay

*** End of Addendum ***
EXAM:
OVER-READ INTERPRETATION  CT CHEST

The following report is an over-read performed by radiologist Dr.
Shaheed Lashae [REDACTED] on 11/30/2020. This
over-read does not include interpretation of cardiac or coronary
anatomy or pathology. The coronary calcium score interpretation by
the cardiologist is attached.
FINDINGS: Within the visualized portions of the thorax there are no suspicious
appearing pulmonary nodules or masses, there is no acute
consolidative airspace disease, no pleural effusions, no
pneumothorax and no lymphadenopathy. Visualized portions of the
upper abdomen are unremarkable. There are no aggressive appearing
lytic or blastic lesions noted in the visualized portions of the
skeleton.
IMPRESSION: 1. No significant incidental noncardiac findings are noted.

## 2022-03-19 ENCOUNTER — Ambulatory Visit: Payer: 59

## 2022-04-04 ENCOUNTER — Ambulatory Visit: Payer: Self-pay | Admitting: Family Medicine

## 2022-04-04 ENCOUNTER — Encounter: Payer: Self-pay | Admitting: Family Medicine

## 2022-04-04 DIAGNOSIS — Z113 Encounter for screening for infections with a predominantly sexual mode of transmission: Secondary | ICD-10-CM

## 2022-04-04 LAB — HM HIV SCREENING LAB: HM HIV Screening: NEGATIVE

## 2022-04-04 LAB — WET PREP FOR TRICH, YEAST, CLUE
Trichomonas Exam: NEGATIVE
Yeast Exam: NEGATIVE

## 2022-04-04 NOTE — Progress Notes (Signed)
Patient seen for STD screening. Wet prep reviewed, no tx per standing orders.

## 2022-04-04 NOTE — Progress Notes (Signed)
[facility] Department  STI clinic/screening visit [address] [place] [address] [phone]  Subjective:  Traci Petersen is a 31 y.o. female being seen today for an STI screening visit. The patient reports they do not have symptoms.  Patient reports that they do not desire a pregnancy in the next year.   They reported they are not interested in discussing contraception today.    Patient's last menstrual period was 04/03/2022 (exact date).   Patient has the following medical conditions:   Patient Active Problem List   Diagnosis Date Noted   Abnormal Pap smear of cervix 05/03/21 neg HPV+ 08/11/2021   Hypercholesterolemia 11/02/2019    Chief Complaint  Patient presents with   SEXUALLY TRANSMITTED DISEASE    Screening     HPI  Patient reports she is here for STD screen.  Client denies symptoms  Last HIV test per patient/review of record was 08/11/2021 Patient reports last pap was 11/22/2017  Screening for MPX risk: Does the patient have an unexplained rash? No Is the patient MSM? No Does the patient endorse multiple sex partners or anonymous sex partners? No Did the patient have close or sexual contact with a person diagnosed with MPX? No Has the patient traveled outside the [place] where MPX is endemic? No Is there a high clinical suspicion for MPX-- evidenced by one of the following No  -Unlikely to be chickenpox  -Lymphadenopathy  -Rash that present in same phase of evolution on any given body part See flowsheet for further details and programmatic requirements.   Immunization history:  Immunization History  Administered Date(s) Administered   Hepatitis B 05/23/2001, 06/27/2001, 12/05/2001   Hpv-Unspecified 02/15/2006, 08/08/2007, 10/26/2008   Influenza-Unspecified 07/13/2016   Moderna Sars-Covid-2 Vaccination 07/16/2020, 08/14/2020   Tdap 08/25/2014   Varicella 01/08/2015     The following portions of the patient's history were reviewed  and updated as appropriate: allergies, current medications, past medical history, past social history, past surgical history and problem list.  Objective:  There were no vitals filed for this visit.  Physical Exam Constitutional:      Appearance: Normal appearance.  HENT:     Head: Normocephalic and atraumatic.  Pulmonary:     Effort: Pulmonary effort is normal.  Abdominal:     Palpations: Abdomen is soft.  Genitourinary:    Comments: Pelvic exam deferred per client Client self-collect wet prep and GC/chlamydia Musculoskeletal:        General: Normal range of motion.  Skin:    General: Skin is warm and dry.  Neurological:     General: No focal deficit present.     Mental Status: She is alert.  Psychiatric:        Mood and Affect: Mood normal.        Behavior: Behavior normal.      Assessment and Plan:  Traci Petersen is a 31 y.o. female presenting to the [facility] Department for STI screening  1. Screening examination for venereal disease Treat wet prep as per SO. - Chlamydia/Gonorrhea [facility] - HIV [facility] - Syphilis [facility], [facility] - Chlamydia/Gonorrhea [facility] - WET PREP FOR TRICH, YEAST, CLUE     Return if symptoms worsen or fail to improve.  Future Appointments  Date Time Provider Department Center  05/08/2022  2:30 PM Doreene Burke, CNM [facility] None    Larene Pickett, FNP

## 2022-05-08 ENCOUNTER — Ambulatory Visit (INDEPENDENT_AMBULATORY_CARE_PROVIDER_SITE_OTHER): Payer: 59 | Admitting: Certified Nurse Midwife

## 2022-05-08 ENCOUNTER — Encounter: Payer: Self-pay | Admitting: Certified Nurse Midwife

## 2022-05-08 ENCOUNTER — Other Ambulatory Visit (HOSPITAL_COMMUNITY)
Admission: RE | Admit: 2022-05-08 | Discharge: 2022-05-08 | Disposition: A | Discharge status: Home / Self Care | Payer: 59 | Source: Ambulatory Visit | Attending: Certified Nurse Midwife | Admitting: Certified Nurse Midwife

## 2022-05-08 VITALS — BP 114/74 | HR 67 | Ht 66.0 in | Wt 176.0 lb

## 2022-05-08 DIAGNOSIS — Z01419 Encounter for gynecological examination (general) (routine) without abnormal findings: Secondary | ICD-10-CM | POA: Diagnosis not present

## 2022-05-08 DIAGNOSIS — Z124 Encounter for screening for malignant neoplasm of cervix: Secondary | ICD-10-CM | POA: Diagnosis not present

## 2022-05-08 NOTE — Progress Notes (Signed)
GYNECOLOGY ANNUAL PREVENTATIVE CARE ENCOUNTER NOTE  History:     Traci Petersen is a 31 y.o. G36P2002 female here for a routine annual gynecologic exam.  Current complaints: lefts sided breast pain .   Denies abnormal vaginal bleeding, discharge, pelvic pain, problems with intercourse or other gynecologic concerns.     Social Relationship: dating ( not sexually active currently) Living: with 2 daughters Work: Office manager Exercise: 2-3 x wk  Smoke/Alcohol/drug use:  Gynecologic History No LMP recorded. Contraception: none Last Pap: 05/03/2021. Results were: normal with positive HPV Last mammogram: n/a .    Upstream - 05/08/22 1433       Pregnancy Intention Screening   Does the patient want to become pregnant in the next year? No    Does the patient's partner want to become pregnant in the next year? No    Would the patient like to discuss contraceptive options today? No            The pregnancy intention screening data noted above was reviewed. Potential methods of contraception were discussed. The patient elected to proceed with None.   Obstetric History OB History  Gravida Para Term Preterm AB Living  2 2 2     2   SAB IAB Ectopic Multiple Live Births        0 2    # Outcome Date GA Lbr Len/2nd Weight Sex Delivery Anes PTL Lv  2 Term 01/06/15 [redacted]w[redacted]d 06:37 / 00:20 6 lb 6.8 oz (2.914 kg) F Vag-Spont EPI  LIV  1 Term 10/20/12   6 lb 5 oz (2.863 kg) F Vag-Spont  N LIV    Past Medical History:  Diagnosis Date   Bunion    Chlamydia    Vaginal Pap smear, abnormal     Past Surgical History:  Procedure Laterality Date   FOOT SURGERY      Current Outpatient Medications on File Prior to Visit  Medication Sig Dispense Refill   atorvastatin (LIPITOR) 40 MG tablet Take 40 mg by mouth daily.     ketoconazole (NIZORAL) 2 % shampoo LATHER ON TO FACE FOR 3 5 MINUTES, THEN RINSE OFF. DO THIS THREE TIMES WEEKLY IF NEEDED FOR FLARES.      norelgestromin-ethinyl estradiol (ORTHO EVRA) 150-35 MCG/24HR transdermal patch Place 1 patch onto the skin once a week. 3 patch 12   pantoprazole (PROTONIX) 40 MG tablet Take 1 tablet (40 mg total) by mouth daily. 30 tablet 1   rosuvastatin (CRESTOR) 40 MG tablet Take by mouth.     Current Facility-Administered Medications on File Prior to Visit  Medication Dose Route Frequency Provider Last Rate Last Admin   medroxyPROGESTERone (DEPO-PROVERA) injection 150 mg  150 mg Intramuscular Once Donnal Moat A, CNM        Allergies  Allergen Reactions   Pantoprazole Swelling and Hives    Also had facial swelling, throat swelling, and hand swelling    Social History:  reports that she has never smoked. She has never used smokeless tobacco. She reports that she does not currently use alcohol after a past usage of about 1.0 standard drink of alcohol per week. She reports that she does not use drugs.  Family History  Problem Relation Age of Onset   Heart disease Father    Hypertension Father    Alcohol abuse Maternal Grandfather    Breast cancer Paternal Grandmother    Hypertension Paternal Grandmother    Alcohol abuse Paternal Grandfather    Heart murmur Daughter  Down syndrome Daughter     The following portions of the patient's history were reviewed and updated as appropriate: allergies, current medications, past family history, past medical history, past social history, past surgical history and problem list.  Review of Systems Pertinent items noted in HPI and remainder of comprehensive ROS otherwise negative.  Physical Exam:  BP 114/74   Pulse 67   Ht 5\' 6"  (1.676 m)   Wt 176 lb (79.8 kg)   BMI 28.41 kg/m  CONSTITUTIONAL: Well-developed, well-nourished female in no acute distress.  HENT:  Normocephalic, atraumatic, External right and left ear normal. Oropharynx is clear and moist EYES: Conjunctivae and EOM are normal. Pupils are equal, round, and reactive to light. No  scleral icterus.  NECK: Normal range of motion, supple, no masses.  Normal thyroid.  SKIN: Skin is warm and dry. No rash noted. Not diaphoretic. No erythema. No pallor. MUSCULOSKELETAL: Normal range of motion. No tenderness.  No cyanosis, clubbing, or edema.  2+ distal pulses. NEUROLOGIC: Alert and oriented to person, place, and time. Normal reflexes, muscle tone coordination.  PSYCHIATRIC: Normal mood and affect. Normal behavior. Normal judgment and thought content. CARDIOVASCULAR: Normal heart rate noted, regular rhythm RESPIRATORY: Clear to auscultation bilaterally. Effort and breath sounds normal, no problems with respiration noted. BREASTS: Symmetric in size. No masses, tenderness, skin changes, nipple drainage, or lymphadenopathy bilaterally.  ABDOMEN: Soft, no distention noted.  No tenderness, rebound or guarding.  Normal, likely due to lifting her daughter & exercise ( recommend good supportive bra).  PELVIC: Normal appearing external genitalia and urethral meatus; normal appearing vaginal mucosa and cervix.  No abnormal discharge noted.  Pap smear obtained. Contact bleeding.  Normal uterine size, no other palpable masses, no uterine or adnexal tenderness.  .   Assessment and Plan:    1. Well woman exam with routine gynecological exam  Pap: Will follow up results of pap smear and manage accordingly. Mammogram : n/a  Labs: none Refills: none  Referral: none  Routine preventative health maintenance measures emphasized. Please refer to After Visit Summary for other counseling recommendations.      , CNM [facility],  [facility]

## 2022-05-08 NOTE — Patient Instructions (Signed)

## 2022-05-15 LAB — CYTOLOGY - PAP
Chlamydia: NEGATIVE
Comment: NEGATIVE
Comment: NEGATIVE
Comment: NEGATIVE
Comment: NORMAL
Diagnosis: NEGATIVE
High risk HPV: NEGATIVE
Neisseria Gonorrhea: NEGATIVE
Trichomonas: NEGATIVE

## 2022-07-17 ENCOUNTER — Ambulatory Visit (INDEPENDENT_AMBULATORY_CARE_PROVIDER_SITE_OTHER): Payer: 59

## 2022-07-17 ENCOUNTER — Other Ambulatory Visit (HOSPITAL_COMMUNITY)
Admission: RE | Admit: 2022-07-17 | Discharge: 2022-07-17 | Disposition: A | Discharge status: Home / Self Care | Payer: 59 | Source: Ambulatory Visit | Attending: Obstetrics | Admitting: Obstetrics

## 2022-07-17 VITALS — BP 112/62 | HR 79 | Wt 183.0 lb

## 2022-07-17 DIAGNOSIS — R350 Frequency of micturition: Secondary | ICD-10-CM

## 2022-07-17 DIAGNOSIS — N3000 Acute cystitis without hematuria: Secondary | ICD-10-CM

## 2022-07-17 LAB — POCT URINALYSIS DIPSTICK
Blood, UA: NEGATIVE
Glucose, UA: NEGATIVE
Ketones, UA: NEGATIVE
Nitrite, UA: NEGATIVE
Spec Grav, UA: >=1.03 — AB (ref 1.010–1.025)
Urobilinogen, UA: 0.2 E.U./dL
pH, UA: 5 (ref 5.0–8.0)

## 2022-07-17 MED ORDER — SULFAMETHOXAZOLE-TRIMETHOPRIM 800-160 MG PO TABS: 1.0000 | ORAL_TABLET | Freq: Two times a day (BID) | ORAL | 0 refills | Status: DC

## 2022-07-17 NOTE — Patient Instructions (Signed)
Urinary Tract Infection, Adult A urinary tract infection (UTI) is an infection of any part of the urinary tract. The urinary tract includes: The kidneys. The ureters. The bladder. The urethra. These organs make, store, and get rid of pee (urine) in the body. What are the causes? This infection is caused by germs (bacteria) in your genital area. These germs grow and cause swelling (inflammation) of your urinary tract. What increases the risk? The following factors may make you more likely to develop this condition: Using a small, thin tube (catheter) to drain pee. Not being able to control when you pee or poop (incontinence). Being female. If you are female, these things can increase the risk: Using these methods to prevent pregnancy: A medicine that kills sperm (spermicide). A device that blocks sperm (diaphragm). Having low levels of a female hormone (estrogen). Being pregnant. You are more likely to develop this condition if: You have genes that add to your risk. You are sexually active. You take antibiotic medicines. You have trouble peeing because of: A prostate that is bigger than normal, if you are female. A blockage in the part of your body that drains pee from the bladder. A kidney stone. A nerve condition that affects your bladder. Not getting enough to drink. Not peeing often enough. You have other conditions, such as: Diabetes. A weak disease-fighting system (immune system). Sickle cell disease. Gout. Injury of the spine. What are the signs or symptoms? Symptoms of this condition include: Needing to pee right away. Peeing small amounts often. Pain or burning when peeing. Blood in the pee. Pee that smells bad or not like normal. Trouble peeing. Pee that is cloudy. Fluid coming from the vagina, if you are female. Pain in the belly or lower back. Other symptoms include: Vomiting. Not feeling hungry. Feeling mixed up (confused). This may be the first symptom in  older adults. Being tired and grouchy (irritable). A fever. Watery poop (diarrhea). How is this treated? Taking antibiotic medicine. Taking other medicines. Drinking enough water. In some cases, you may need to see a specialist. Follow these instructions at home:  Medicines Take over-the-counter and prescription medicines only as told by your doctor. If you were prescribed an antibiotic medicine, take it as told by your doctor. Do not stop taking it even if you start to feel better. General instructions Make sure you: Pee until your bladder is empty. Do not hold pee for a long time. Empty your bladder after sex. Wipe from front to back after peeing or pooping if you are a female. Use each tissue one time when you wipe. Drink enough fluid to keep your pee pale yellow. Keep all follow-up visits. Contact a doctor if: You do not get better after 1-2 days. Your symptoms go away and then come back. Get help right away if: You have very bad back pain. You have very bad pain in your lower belly. You have a fever. You have chills. You feeling like you will vomit or you vomit. Summary A urinary tract infection (UTI) is an infection of any part of the urinary tract. This condition is caused by germs in your genital area. There are many risk factors for a UTI. Treatment includes antibiotic medicines. Drink enough fluid to keep your pee pale yellow. This information is not intended to replace advice given to you by your health care provider. Make sure you discuss any questions you have with your health care provider. Document Revised: 03/11/2020 Document Reviewed: 03/11/2020 Elsevier Patient Education    2023 Elsevier Inc.     Sulfamethoxazole; Trimethoprim Tablets  What is this medication? SULFAMETHOXAZOLE; TRIMETHOPRIM (suhl fuh meth OK suh zohl; trye METH oh prim) treats infections caused by bacteria. It belongs to a group of medications called sulfonamide antibiotics. It will not  treat colds, the flu, or infections caused by viruses. This medicine may be used for other purposes; ask your health care provider or pharmacist if you have questions. COMMON BRAND NAME(S): Bacter-Aid DS, Bactrim, Bactrim DS, Septra, Septra DS What should I tell my care team before I take this medication? They need to know if you have any of these conditions: G6PD deficiency HIV or AIDS Kidney disease Liver disease Low platelet levels Low red blood cell levels Poor nutrition Stomach or intestine problems, such as colitis Thyroid disease An unusual or allergic reaction to sulfamethoxazole, trimethoprim, other medications, foods, dyes, or preservatives Pregnant or trying to get pregnant Breast-feeding How should I use this medication? Take this medication by mouth with a glass of water. Follow the directions on the prescription label. Take your medication at regular intervals. Do not take it more often than directed. Take all of your medication as directed even if you think you are better. Do not skip doses or stop your medication early. Talk to your care team about the use of this medication in children. While this medication may be prescribed for children as young as 2 months for selected conditions, precautions do apply. Overdosage: If you think you have taken too much of this medicine contact a poison control center or emergency room at once. NOTE: This medicine is only for you. Do not share this medicine with others. What if I miss a dose? If you miss a dose, take it as soon as you can. If it is almost time for your next dose, take only that dose. Do not take double or extra doses. What may interact with this medication? Do not take this medication with any of the following: Dofetilide This medication may also interact with the following: Amantadine Certain medications for blood pressure or heart disease Certain medications for depression, such as amitriptyline Certain medications  for diabetes, such as glipizide or glyburide Certain medications that treat or prevent blood clots, such as warfarin Cyclosporine Digoxin Diuretics Estrogen and progestin hormones Indomethacin Methotrexate Phenytoin Procainamide Pyrimethamine Zidovudine This list may not describe all possible interactions. Give your health care provider a list of all the medicines, herbs, non-prescription drugs, or dietary supplements you use. Also tell them if you smoke, drink alcohol, or use illegal drugs. Some items may interact with your medicine. What should I watch for while using this medication? Tell your care team if your symptoms do not start to get better or if they get worse. Do not treat diarrhea with over the counter products. Contact your care team if you have diarrhea that lasts more than 2 days or if it is severe and watery. This medication may cause serious skin reactions. They can happen weeks to months after starting the medication. Contact your care team right away if you notice fevers or flu-like symptoms with a rash. The rash may be red or purple and then turn into blisters or peeling of the skin. Or, you might notice a red rash with swelling of the face, lips or lymph nodes in your neck or under your arms. This medication can make you more sensitive to the sun. Keep out of the sun. If you cannot avoid being in the sun, wear protective clothing   and sunscreen. Do not use sun lamps or tanning beds/booths. Be careful brushing or flossing your teeth or using a toothpick because you may get an infection or bleed more easily. If you have any dental work done, tell your dentist you are receiving this medication. What side effects may I notice from receiving this medication? Side effects that you should report to your care team as soon as possible: Allergic reactions--skin rash, itching, hives, swelling of the face, lips, tongue, or throat Aplastic anemia--unusual weakness or fatigue, dizziness,  headache, trouble breathing, increased bleeding or bruising, fever, chills, cough, or sore throat Dry cough, shortness of breath or trouble breathing High potassium level--muscle weakness, fast or irregular heartbeat Liver injury-- right upper belly pain, loss of appetite, nausea, light-colored stool, dark yellow or brown urine, yellowing skin or eyes, unusual weakness or fatigue Low blood sugar (hypoglycemia)--tremors or shaking, anxiety, sweating, cold or clammy skin, confusion, dizziness, rapid heartbeat Low sodium level--muscle weakness, fatigue, dizziness, headache, confusion Low thyroid levels (hypothyroidism)--unusual weakness or fatigue, increased sensitivity to cold, constipation, hair loss, dry skin, weight gain, feelings of depression Rash, fever, and swollen lymph nodes Redness, blistering, peeling, or loosening of the skin, including inside the mouth Severe diarrhea, fever Small, pus-filled bumps on skin Unusual vaginal discharge, itching, or odor Side effects that usually do not require medical attention (report to your care team if they continue or are bothersome): Loss of appetite Nausea Vomiting This list may not describe all possible side effects. Call your doctor for medical advice about side effects. You may report side effects to FDA at 1-800-FDA-1088. Where should I keep my medication? Keep out of the reach of children. Store between 15 and 25 degrees C (59 to 77 degrees F). Protect from light. Keep the container tightly closed. Throw away any unused medication after the expiration date. NOTE: This sheet is a summary. It may not cover all possible information. If you have questions about this medicine, talk to your doctor, pharmacist, or health care provider.  2023 Elsevier/Gold Standard (2020-10-31 00:00:00)   

## 2022-07-17 NOTE — Addendum Note (Signed)
Addended by: Fonda Kinder on: 07/17/2022 03:23 PM   Modules accepted: Orders

## 2022-07-17 NOTE — Progress Notes (Signed)
Subjective:    Traci Petersen is a 31 y.o. female who complains of frequency, suprapubic pressure, and urgency for 4 days.  Patient denies back pain, congestion, cough, fever, headache, rhinitis, sorethroat, stomach ache, and vaginal discharge.  Patient does not have a history of recurrent UTI.  Patient does not have a history of pyelonephritis. Patient reports that she did take one dose of otc Azo for urinary relief.  The following portions of the patient's history were reviewed and updated as appropriate: allergies, current medications, past medical history, and problem list. Review of Systems Pertinent items are noted in HPI.    Objective:    BP 112/62   Pulse 79   Wt 183 lb (83 kg)   LMP 07/08/2022 (Exact Date)   BMI 29.54 kg/m  General: alert and cooperative            Laboratory:  Urine dipstick shows  negative, unable to read multistix some values were out of range do to medication ( Azo) .   Micro exam: not done.    Assessment:    Acute cystitis    Plan: Plan:    1. Medications: TMP/SMX 2. Maintain adequate hydration 3. Follow up if symptoms not improving, and prn.   Nicholos Johns. [facility]

## 2022-07-17 NOTE — Addendum Note (Signed)
Addended by: Fonda Kinder on: 07/17/2022 03:28 PM   Modules accepted: Orders

## 2022-07-19 LAB — URINE CULTURE

## 2022-07-19 LAB — CERVICOVAGINAL ANCILLARY ONLY
Bacterial Vaginitis (gardnerella): POSITIVE — AB
Candida Glabrata: NEGATIVE
Candida Vaginitis: NEGATIVE
Chlamydia: NEGATIVE
Comment: NEGATIVE
Comment: NEGATIVE
Comment: NEGATIVE
Comment: NEGATIVE
Comment: NEGATIVE
Comment: NORMAL
Neisseria Gonorrhea: NEGATIVE
Trichomonas: NEGATIVE

## 2022-07-31 ENCOUNTER — Ambulatory Visit: Payer: 59 | Admitting: Nurse Practitioner

## 2022-07-31 ENCOUNTER — Ambulatory Visit: Payer: 59

## 2022-07-31 ENCOUNTER — Ambulatory Visit: Payer: Self-pay

## 2022-07-31 ENCOUNTER — Encounter: Payer: Self-pay | Admitting: Nurse Practitioner

## 2022-07-31 DIAGNOSIS — Z113 Encounter for screening for infections with a predominantly sexual mode of transmission: Secondary | ICD-10-CM

## 2022-07-31 LAB — WET PREP FOR TRICH, YEAST, CLUE
Trichomonas Exam: NEGATIVE
Yeast Exam: NEGATIVE

## 2022-07-31 LAB — HM HIV SCREENING LAB: HM HIV Screening: NEGATIVE

## 2022-07-31 NOTE — Progress Notes (Signed)
[facility] Department  STI clinic/screening visit [address] [place] [address] [phone]  Subjective:  Traci Petersen is a 31 y.o. female being seen today for an STI screening visit. The patient reports they do have symptoms.  Patient reports that they do not desire a pregnancy in the next year.   They reported they are not interested in discussing contraception today.    Patient's last menstrual period was 07/08/2022 (exact date).   Patient has the following medical conditions:   Patient Active Problem List   Diagnosis Date Noted   Abnormal Pap smear of cervix 05/03/21 neg HPV+ 08/11/2021   Hypercholesterolemia 11/02/2019    Chief Complaint  Patient presents with   SEXUALLY TRANSMITTED DISEASE    HPI  Patient reports to clinic today for STD screening.  Patient states 3 weeks ago she was diagnosed with a UTI and was treated with an antibiotic.  Patient reports symptoms of lower abdominal pain and dysuria has improved.    Does the patient using douching products? No  Last HIV test per patient/review of record was  Lab Results  Component Value Date   HMHIVSCREEN Negative - Validated 04/04/2022    Lab Results  Component Value Date   HIV non reactive 10/27/2020   Patient reports last pap was  Lab Results  Component Value Date   DIAGPAP  05/08/2022    - Negative for intraepithelial lesion or malignancy (NILM)     Screening for MPX risk: Does the patient have an unexplained rash? No Is the patient MSM? No Does the patient endorse multiple sex partners or anonymous sex partners? No Did the patient have close or sexual contact with a person diagnosed with MPX? No Has the patient traveled outside the [place] where MPX is endemic? No Is there a high clinical suspicion for MPX-- evidenced by one of the following No  -Unlikely to be chickenpox  -Lymphadenopathy  -Rash that present in same phase of evolution on any given body part See flowsheet  for further details and programmatic requirements.   Immunization history:  Immunization History  Administered Date(s) Administered   Hepatitis B 05/23/2001, 06/27/2001, 12/05/2001   Hpv-Unspecified 02/15/2006, 08/08/2007, 10/26/2008   Influenza-Unspecified 07/13/2016   Moderna Sars-Covid-2 Vaccination 07/16/2020, 08/14/2020   Tdap 08/25/2014   Varicella 01/08/2015     The following portions of the patient's history were reviewed and updated as appropriate: allergies, current medications, past medical history, past social history, past surgical history and problem list.  Objective:  There were no vitals filed for this visit.  Physical Exam Constitutional:      Appearance: Normal appearance.  HENT:     Head: Normocephalic. No abrasion, masses or laceration. Hair is normal.     Right Ear: External ear normal.     Left Ear: External ear normal.     Nose: Nose normal.     Mouth/Throat:     Lips: Pink.     Mouth: Mucous membranes are moist. No oral lesions.     Pharynx: No oropharyngeal exudate or posterior oropharyngeal erythema.     Tonsils: No tonsillar exudate or tonsillar abscesses.  Eyes:     General: Lids are normal.        Right eye: No discharge.        Left eye: No discharge.     Conjunctiva/sclera: Conjunctivae normal.     Right eye: No exudate.    Left eye: No exudate. Abdominal:     General: Abdomen is  flat.     Palpations: Abdomen is soft.     Tenderness: There is no abdominal tenderness. There is no rebound.  Genitourinary:    Pubic Area: No rash or pubic lice.      Labia:        Right: No rash, tenderness, lesion or injury.        Left: No rash, tenderness, lesion or injury.      Vagina: Normal. No vaginal discharge, erythema or lesions.     Cervix: No cervical motion tenderness, discharge, lesion or erythema.     Uterus: Not enlarged and not tender.      Rectum: Normal.     Comments: Amount Discharge: small Odor: No pH: less than 4.5 Adheres to  vaginal wall: No Color: Blaine Hari  Musculoskeletal:     Cervical back: Full passive range of motion without pain, normal range of motion and neck supple.  Lymphadenopathy:     Cervical: No cervical adenopathy.     Right cervical: No superficial, deep or posterior cervical adenopathy.    Left cervical: No superficial, deep or posterior cervical adenopathy.     Upper Body:     Right upper body: No supraclavicular, axillary or epitrochlear adenopathy.     Left upper body: No supraclavicular, axillary or epitrochlear adenopathy.     Lower Body: No right inguinal adenopathy. No left inguinal adenopathy.  Skin:    General: Skin is warm and dry.     Findings: No lesion or rash.  Neurological:     Mental Status: She is alert and oriented to person, place, and time.  Psychiatric:        Attention and Perception: Attention normal.        Mood and Affect: Mood normal.        Speech: Speech normal.        Behavior: Behavior normal. Behavior is cooperative.      Assessment and Plan:  Traci Petersen is a 31 y.o. female presenting to the [facility] Department for STI screening  1. Screening examination for venereal disease -31 year old female in clinic today for STD screening.  -Patient accepted all screenings including vaginal CT/GC, wet prep and bloodwork for HIV/RPR.  Patient meets criteria for HepB screening? No. Ordered? No - low risk Patient meets criteria for HepC screening? No. Ordered? No - low risk   Treat wet prep per standing order Discussed time line for State Lab results and that patient will be called with positive results and encouraged patient to call if she had not heard in 2 weeks.  Counseled to return or seek care for continued or worsening symptoms Recommended condom use with all sex  Patient is currently not using  contraception  to prevent pregnancy.    - HIV [place] [facility] - Syphilis Serology, [place] [facility] - Chlamydia/Gonorrhea [place] [facility] - WET PREP FOR  [place], YEAST, CLUE  Total time spent: 30 minutes   Return if symptoms worsen or fail to improve.    Gregary Cromer, FNP

## 2022-07-31 NOTE — Progress Notes (Signed)
Wet prep reviewed - negative results. Eleanore Junio, RN  

## 2022-10-02 ENCOUNTER — Ambulatory Visit: Payer: Self-pay | Admitting: Family Medicine

## 2022-10-02 ENCOUNTER — Ambulatory Visit (LOCAL_COMMUNITY_HEALTH_CENTER): Payer: 59 | Admitting: Family Medicine

## 2022-10-02 ENCOUNTER — Encounter: Payer: Self-pay | Admitting: Family Medicine

## 2022-10-02 VITALS — BP 110/71 | Ht 66.0 in | Wt 181.2 lb

## 2022-10-02 DIAGNOSIS — Z3009 Encounter for other general counseling and advice on contraception: Secondary | ICD-10-CM

## 2022-10-02 DIAGNOSIS — Z113 Encounter for screening for infections with a predominantly sexual mode of transmission: Secondary | ICD-10-CM

## 2022-10-02 DIAGNOSIS — Z30013 Encounter for initial prescription of injectable contraceptive: Secondary | ICD-10-CM | POA: Diagnosis not present

## 2022-10-02 LAB — WET PREP FOR TRICH, YEAST, CLUE
Trichomonas Exam: NEGATIVE
Yeast Exam: NEGATIVE

## 2022-10-02 LAB — HM HIV SCREENING LAB: HM HIV Screening: NEGATIVE

## 2022-10-02 MED ORDER — MEDROXYPROGESTERONE ACETATE 150 MG/ML IM SUSP
150.0000 mg | INTRAMUSCULAR | Status: AC
  Administered 2022-10-02: 150 mg via INTRAMUSCULAR

## 2022-10-02 NOTE — Progress Notes (Signed)
[facility] Department  STI clinic/screening visit [place] [address] [phone]  Subjective:  Traci Petersen is a 32 y.o. female being seen today for an STI screening visit. The patient reports they do not have symptoms.  Patient reports that they do not desire a pregnancy in the next year.   They reported they are not interested in discussing contraception today.    Patient's last menstrual period was 09/22/2022 (exact date).  Patient has the following medical conditions:   Patient Active Problem List   Diagnosis Date Noted   Abnormal Pap smear of cervix 05/03/21 neg HPV+ 08/11/2021   Hypercholesterolemia 11/02/2019    Chief Complaint  Patient presents with   SEXUALLY TRANSMITTED DISEASE    STI screening-no symptoms-did note some urinary frequency a few days ago but not currently    HPI  Patient reports to clinic for STI testing  Does the patient using douching products? No  Last HIV test per patient/review of record was  Lab Results  Component Value Date   HMHIVSCREEN Negative - Validated 07/31/2022    Lab Results  Component Value Date   HIV non reactive 10/27/2020   Patient reports last pap was  Lab Results  Component Value Date   DIAGPAP  05/08/2022    - Negative for intraepithelial lesion or malignancy (NILM)   Screening for MPX risk: Does the patient have an unexplained rash? No Is the patient MSM? No Does the patient endorse multiple sex partners or anonymous sex partners? No Did the patient have close or sexual contact with a person diagnosed with MPX? No Has the patient traveled outside the [place] where MPX is endemic? No Is there a high clinical suspicion for MPX-- evidenced by one of the following No  -Unlikely to be chickenpox  -Lymphadenopathy  -Rash that present in same phase of evolution on any given body part See flowsheet for further details and programmatic requirements.   Immunization history:   Immunization History  Administered Date(s) Administered   Hepatitis B 05/23/2001, 06/27/2001, 12/05/2001   Hpv-Unspecified 02/15/2006, 08/08/2007, 10/26/2008   Influenza-Unspecified 07/13/2016   Moderna Sars-Covid-2 Vaccination 07/16/2020, 08/14/2020   Tdap 08/25/2014   Varicella 01/08/2015     The following portions of the patient's history were reviewed and updated as appropriate: allergies, current medications, past medical history, past social history, past surgical history and problem list.  Objective:  There were no vitals filed for this visit.  Physical Exam Vitals and nursing note reviewed.  Constitutional:      Appearance: Normal appearance.  HENT:     Head: Normocephalic and atraumatic.     Mouth/Throat:     Mouth: Mucous membranes are moist.     Pharynx: Oropharynx is clear. No oropharyngeal exudate or posterior oropharyngeal erythema.  Pulmonary:     Effort: Pulmonary effort is normal.  Abdominal:     General: Abdomen is flat.     Palpations: There is no mass.     Tenderness: There is no abdominal tenderness. There is no rebound.  Genitourinary:    Vagina: Normal. No erythema, bleeding or lesions.     Cervix: No cervical motion tenderness, discharge, friability, lesion or erythema.     Uterus: Normal.      Adnexa: Right adnexa normal and left adnexa normal.     Comments: Declined genital exam- self swabbed Lymphadenopathy:     Head:     Right side of head: No preauricular or posterior auricular adenopathy.  Left side of head: No preauricular or posterior auricular adenopathy.     Cervical: No cervical adenopathy.     Upper Body:     Right upper body: No supraclavicular, axillary or epitrochlear adenopathy.     Left upper body: No supraclavicular, axillary or epitrochlear adenopathy.  Skin:    General: Skin is warm and dry.     Findings: No rash.  Neurological:     Mental Status: She is alert and oriented to person, place, and time.      Assessment  and Plan:  Traci Petersen is a 32 y.o. female presenting to the [facility] Department for STI screening  1. Screening for venereal disease  - WET PREP FOR [place], YEAST, CLUE - HIV [place] [facility] - Syphilis Serology, [place] [facility] - [place] [facility]   Patient accepted all screenings including, vaginal CT/GC and bloodwork for HIV/RPR, and wet prep. Patient meets criteria for HepB screening? No. Ordered? not applicable Patient meets criteria for HepC screening? No. Ordered? not applicable  Treat wet prep per standing order Discussed time line for State Lab results and that patient will be called with positive results and encouraged patient to call if she had not heard in 2 weeks.  Counseled to return or seek care for continued or worsening symptoms Recommended repeat testing in 3 months with positive results. Recommended condom use with all sex  Patient is currently using Hormonal Contraception: Injection, Rings and Patches to prevent pregnancy.    Return if symptoms worsen or fail to improve.  Future Appointments  Date Time Provider [place]  10/02/2022  1:20 PM Sharlet Salina, [place] [facility]-STI None    Sharlet Salina, [place]

## 2022-10-02 NOTE — Progress Notes (Signed)
Pt here for STI screening.  Denies symptoms other than some urinary frequency several days ago but resolved now.  Wet mount results reviewed.  No treatment given at this time.Condoms declined.-Forest Becker, RN

## 2022-10-02 NOTE — Progress Notes (Signed)
Pt here for contraception.  Depo Provera 113m IM given in RUOQ without complications.  Pt counseled to use condoms x 7 days post injection as back up method.  Declines cKatherine Roan RN

## 2022-10-02 NOTE — Progress Notes (Signed)
Pt is here in clinic for STI visit. Wanted depo today for contraception. Last period 09/22/22, last sex 09/25/22  1. Family planning  - medroxyPROGESTERone (DEPO-PROVERA) injection 150 mg    [facility] FNP-C

## 2022-10-04 ENCOUNTER — Telehealth: Payer: Self-pay | Admitting: Family Medicine

## 2022-10-04 NOTE — Telephone Encounter (Signed)
Pt states that one of her labs was abnormal, but that she was told she did not need medication; and to call back if she had any symptoms. She is experiencing some symptoms and she would like for someone from the clinic to call her back, to see how to proceed. Thanks

## 2022-10-05 ENCOUNTER — Other Ambulatory Visit: Payer: Self-pay | Admitting: Family Medicine

## 2022-10-05 ENCOUNTER — Other Ambulatory Visit: Payer: Self-pay

## 2022-10-05 DIAGNOSIS — N760 Acute vaginitis: Secondary | ICD-10-CM

## 2022-10-05 MED ORDER — METRONIDAZOLE 500 MG PO TABS: 500.0000 mg | ORAL_TABLET | Freq: Two times a day (BID) | ORAL | 0 refills | Status: AC

## 2022-11-13 DIAGNOSIS — J22 Unspecified acute lower respiratory infection: Secondary | ICD-10-CM | POA: Diagnosis not present

## 2022-12-27 ENCOUNTER — Ambulatory Visit: Payer: 59

## 2022-12-27 ENCOUNTER — Ambulatory Visit: Payer: 59 | Admitting: Physician Assistant

## 2022-12-27 ENCOUNTER — Encounter: Payer: Self-pay | Admitting: Emergency Medicine

## 2022-12-27 ENCOUNTER — Encounter: Payer: Self-pay | Admitting: Physician Assistant

## 2022-12-27 ENCOUNTER — Ambulatory Visit
Admission: EM | Admit: 2022-12-27 | Discharge: 2022-12-27 | Disposition: A | Discharge status: Home / Self Care | Payer: 59 | Attending: Emergency Medicine | Admitting: Emergency Medicine

## 2022-12-27 DIAGNOSIS — J069 Acute upper respiratory infection, unspecified: Secondary | ICD-10-CM | POA: Diagnosis not present

## 2022-12-27 DIAGNOSIS — Z113 Encounter for screening for infections with a predominantly sexual mode of transmission: Secondary | ICD-10-CM

## 2022-12-27 LAB — HM HIV SCREENING LAB: HM HIV Screening: NEGATIVE

## 2022-12-27 LAB — WET PREP FOR TRICH, YEAST, CLUE
Trichomonas Exam: NEGATIVE
Yeast Exam: NEGATIVE

## 2022-12-27 LAB — GROUP A STREP BY PCR: Group A Strep by PCR: NOT DETECTED

## 2022-12-27 MED ORDER — IPRATROPIUM BROMIDE 0.06 % NA SOLN: 2.0000 | Freq: Four times a day (QID) | NASAL | 12 refills | Status: DC

## 2022-12-27 MED ORDER — PROMETHAZINE-DM 6.25-15 MG/5ML PO SYRP: 5.0000 mL | ORAL_SOLUTION | Freq: Four times a day (QID) | ORAL | 0 refills | Status: DC | PRN

## 2022-12-27 MED ORDER — BENZONATATE 100 MG PO CAPS: 200.0000 mg | ORAL_CAPSULE | Freq: Three times a day (TID) | ORAL | 0 refills | Status: DC

## 2022-12-27 NOTE — Discharge Instructions (Addendum)
Your strep test is negative but your physical exam does reveal signs of an upper respiratory infection and is most likely viral.  Please use over-the-counter Tylenol and/or ibuprofen according to the package instructions as needed for any pain or fever you may develop.  Use the Atrovent nasal spray, 2 squirts in each nostril every 6 hours, as needed for runny nose and postnasal drip.  Use the Tessalon Perles every 8 hours during the day.  Take them with a small sip of water.  They may give you some numbness to the base of your tongue or a metallic taste in your mouth, this is normal.  Use the Promethazine DM cough syrup at bedtime for cough and congestion.  It will make you drowsy so do not take it during the day.  Return for reevaluation or see your primary care provider for any new or worsening symptoms.

## 2022-12-27 NOTE — Progress Notes (Signed)
[facility] Department  STI clinic/screening visit [address] [place] [address] [phone]  Subjective:  Traci Petersen is a 32 y.o. female being seen today for an STI screening visit. The patient reports they do not have symptoms.  Patient reports that they do not desire a pregnancy in the next year.   They reported they are not interested in discussing contraception today.  On DMPA, next injection due in a few weeks.  Patient's last menstrual period was 09/22/2022 (exact date).  Patient has the following medical conditions:   Patient Active Problem List   Diagnosis Date Noted   Abnormal Pap smear of cervix 05/03/21 neg HPV+ 08/11/2021   Hypercholesterolemia 11/02/2019    Chief Complaint  Patient presents with   SEXUALLY TRANSMITTED DISEASE    Woman here for STI screen - asymptomatic, no known contact to infection, feels well.   Does the patient using douching products? No  Last HIV test per patient/review of record was  Lab Results  Component Value Date   HMHIVSCREEN Negative - Validated 10/02/2022    Lab Results  Component Value Date   HIV non reactive 10/27/2020   Patient reports last pap was  Lab Results  Component Value Date   DIAGPAP  05/08/2022    - Negative for intraepithelial lesion or malignancy (NILM)   No results found for: "SPECADGYN"  Screening for MPX risk: Does the patient have an unexplained rash? No Is the patient MSM? No Does the patient endorse multiple sex partners or anonymous sex partners? No Did the patient have close or sexual contact with a person diagnosed with MPX? No Has the patient traveled outside the [place] where MPX is endemic? No Is there a high clinical suspicion for MPX-- evidenced by one of the following No  -Unlikely to be chickenpox  -Lymphadenopathy  -Rash that present in same phase of evolution on any given body part See flowsheet for further details and programmatic requirements.    Immunization history:  Immunization History  Administered Date(s) Administered   Hepatitis B 05/23/2001, 06/27/2001, 12/05/2001   Hpv-Unspecified 02/15/2006, 08/08/2007, 10/26/2008   Influenza-Unspecified 07/13/2016   Moderna Sars-Covid-2 Vaccination 07/16/2020, 08/14/2020   Tdap 08/25/2014   Varicella 01/08/2015     The following portions of the patient's history were reviewed and updated as appropriate: allergies, current medications, past medical history, past social history, past surgical history and problem list.  Objective:  There were no vitals filed for this visit.  Physical Exam Vitals and nursing note reviewed.  Constitutional:      Appearance: Normal appearance.  HENT:     Head: Normocephalic and atraumatic.     Mouth/Throat:     Mouth: Mucous membranes are moist.     Pharynx: Oropharynx is clear. No oropharyngeal exudate or posterior oropharyngeal erythema.  Pulmonary:     Effort: Pulmonary effort is normal.  Abdominal:     General: Abdomen is flat.     Palpations: There is no mass.     Tenderness: There is no abdominal tenderness. There is no rebound.  Genitourinary:    General: Normal vulva.     Exam position: Lithotomy position.     Pubic Area: No rash or pubic lice.      Labia:        Right: No rash or lesion.        Left: No rash or lesion.      Vagina: Normal. No vaginal discharge, erythema, bleeding or lesions.  Cervix: No cervical motion tenderness, discharge, friability, lesion or erythema.     Uterus: Normal.      Adnexa: Right adnexa normal and left adnexa normal.     Rectum: Normal.     Comments: pH = 4.0 Lymphadenopathy:     Head:     Right side of head: No preauricular or posterior auricular adenopathy.     Left side of head: No preauricular or posterior auricular adenopathy.     Cervical: No cervical adenopathy.     Upper Body:     Right upper body: No supraclavicular, axillary or epitrochlear adenopathy.     Left upper body: No  supraclavicular, axillary or epitrochlear adenopathy.     Lower Body: No right inguinal adenopathy. No left inguinal adenopathy.  Skin:    General: Skin is warm and dry.     Findings: No rash.  Neurological:     Mental Status: She is alert and oriented to person, place, and time.      Assessment and Plan:  Traci Petersen is a 32 y.o. female presenting to the [facility] Department for STI screening  1. Screening for venereal disease Treat findings on wet prep per S.O. Pt denies discharge or odor, vag pH 4.0. Pt to await other STI results. - WET PREP FOR [place], YEAST, CLUE - HIV [place] [facility] - Syphilis Serology, [place] [facility] - Chlamydia/Gonorrhea [place] [facility]   Patient accepted all screenings including vaginal CT/GC and bloodwork for HIV/RPR, and wet prep. Patient meets criteria for HepB screening? No. Ordered? no Patient meets criteria for HepC screening? No. Ordered? no  Treat wet prep per standing order Discussed time line for State Lab results and that patient will be called with positive results and encouraged patient to call if she had not heard in 2 weeks.  Counseled to return or seek care for continued or worsening symptoms Recommended repeat testing in 3 months with positive results. Recommended condom use with all sex  Patient is currently using Hormonal Contraception: Injection, Rings and Patches to prevent pregnancy.    Return in about 6 months (around 06/29/2023) for STI screening.  No future appointments.  Landry Dyke, PA-C

## 2022-12-27 NOTE — ED Triage Notes (Signed)
Pt presents with a cough and sore throat x 3- 4 days.

## 2022-12-27 NOTE — Progress Notes (Signed)
Pt appointment for STI screening. Seen by PA Streilein. Wet prep results all negative and reviewed with pt.

## 2022-12-27 NOTE — ED Provider Notes (Signed)
[facility]    CSN: [id number] Arrival date & time: 12/27/22  1823      History   Chief Complaint Chief Complaint  Patient presents with   Cough    cough & sore throat - Entered by patient   Sore Throat    HPI Traci Petersen is a 32 y.o. female.   HPI  32 year old female with a past medical history significant for elevated cholesterol and bunions presents for evaluation of 3 to 4 days worth of sore throat and cough.  She states that her cough is productive for green mucus but she denies any shortness of breath or wheezing.  She also denies fever, runny nose, nasal congestion, or ear pain.  No sick contacts.  Past Medical History:  Diagnosis Date   Bunion    Chlamydia    Elevated cholesterol    Vaginal Pap smear, abnormal     Patient Active Problem List   Diagnosis Date Noted   Abnormal Pap smear of cervix 05/03/21 neg HPV+ 08/11/2021   Hypercholesterolemia 11/02/2019    Past Surgical History:  Procedure Laterality Date   FOOT SURGERY      OB History     Gravida  2   Para  2   Term  2   Preterm      AB      Living  2      SAB      IAB      Ectopic      Multiple  0   Live Births  2            Home Medications    Prior to Admission medications   Medication Sig Start Date End Date Taking? Authorizing Provider  atorvastatin (LIPITOR) 40 MG tablet Take 40 mg by mouth daily. 04/09/19  Yes [provider]  benzonatate (TESSALON) 100 MG capsule Take 2 capsules (200 mg total) by mouth every 8 (eight) hours. 12/27/22  Yes Becky Augusta, NP  ipratropium (ATROVENT) 0.06 % nasal spray Place 2 sprays into both nostrils 4 (four) times daily. 12/27/22  Yes Becky Augusta, NP  promethazine-dextromethorphan (PROMETHAZINE-DM) 6.25-15 MG/5ML syrup Take 5 mLs by mouth 4 (four) times daily as needed. 12/27/22  Yes Becky Augusta, NP  ketoconazole (NIZORAL) 2 % shampoo LATHER ON TO FACE FOR 3 5 MINUTES, THEN RINSE OFF. DO THIS THREE TIMES WEEKLY  IF NEEDED FOR FLARES. Patient not taking: Reported on 07/31/2022 06/20/19   [provider]  rosuvastatin (CRESTOR) 40 MG tablet Take by mouth. 10/19/20 10/19/21  [provider]  rosuvastatin (CRESTOR) 40 MG tablet Take 40 mg by mouth daily.    [provider]  sulfamethoxazole-trimethoprim (BACTRIM DS) 800-160 MG tablet Take 1 tablet by mouth 2 (two) times daily. Patient not taking: Reported on 07/31/2022 07/17/22   Glenetta Borg, CNM    Family History Family History  Problem Relation Age of Onset   Heart disease Father    Hypertension Father    Alcohol abuse Maternal Grandfather    Breast cancer Paternal Grandmother    Hypertension Paternal Grandmother    Alcohol abuse Paternal Grandfather    Heart murmur Daughter    Down syndrome Daughter     Social History Social History   Tobacco Use   Smoking status: Never    Passive exposure: Past   Smokeless tobacco: Never   Tobacco comments:    Passive smoke exposure in childhood.  Vaping Use   Vaping Use: Never used  Substance Use Topics   Alcohol use: Yes    Alcohol/week: 1.0 standard drink of alcohol    Types: 1 Glasses of wine per week    Comment: occasionally   Drug use: No     Allergies   Pantoprazole and Pantoprazole sodium   Review of Systems Review of Systems  Constitutional:  Negative for fever.  HENT:  Positive for sore throat. Negative for congestion, ear pain and rhinorrhea.   Respiratory:  Positive for cough. Negative for shortness of breath and wheezing.      Physical Exam Triage Vital Signs ED Triage Vitals  Enc Vitals Group     BP 12/27/22 1837 112/75     Pulse Rate 12/27/22 1836 72     Resp 12/27/22 1836 18     Temp 12/27/22 1836 99.2 F (37.3 C)     Temp Source 12/27/22 1836 Oral     SpO2 12/27/22 1836 98 %     Weight --      Height --      Head Circumference --      Peak Flow --      Pain Score 12/27/22 1834 5     Pain Loc --      Pain Edu? --      Excl. in  GC? --    No data found.  Updated Vital Signs BP 112/75 (BP Location: Right Arm)   Pulse 72   Temp 99.2 F (37.3 C) (Oral)   Resp 18   LMP 09/22/2022 (Exact Date) Comment: Has been on Depo, does not plan to continue due to side effects.  SpO2 98%   Visual Acuity Right Eye Distance:   Left Eye Distance:   Bilateral Distance:    Right Eye Near:   Left Eye Near:    Bilateral Near:     Physical Exam Vitals and nursing note reviewed.  Constitutional:      Appearance: Normal appearance. She is not ill-appearing.  HENT:     Head: Normocephalic and atraumatic.     Nose: Congestion and rhinorrhea present.     Comments: Mucosa is erythematous and edematous with clear discharge in both nares.    Mouth/Throat:     Mouth: Mucous membranes are moist.     Pharynx: Oropharynx is clear. Posterior oropharyngeal erythema present. No oropharyngeal exudate.     Comments: Bilateral tonsillar pillars are erythematous and edematous but free of exudate.  Posterior oropharynx is also erythematous with clear postnasal drip. Cardiovascular:     Rate and Rhythm: Normal rate and regular rhythm.     Pulses: Normal pulses.     Heart sounds: Normal heart sounds. No murmur heard.    No friction rub. No gallop.  Pulmonary:     Effort: Pulmonary effort is normal.     Breath sounds: Normal breath sounds. No wheezing, rhonchi or rales.  Musculoskeletal:     Cervical back: Normal range of motion and neck supple.  Lymphadenopathy:     Cervical: No cervical adenopathy.  Skin:    General: Skin is warm and dry.     Capillary Refill: Capillary refill takes less than 2 seconds.     Findings: No erythema or rash.  Neurological:     General: No focal deficit present.     Mental Status: She is alert and oriented to person, place, and time.      UC Treatments / Results  Labs (all labs ordered are listed, but only abnormal results are displayed) Labs Reviewed  GROUP A STREP BY PCR     EKG   Radiology No results found.  Procedures Procedures (including critical care time)  Medications Ordered in UC Medications - No data to display  Initial Impression / Assessment and Plan / UC Course  I have reviewed the triage vital signs and the nursing notes.  Pertinent labs & imaging results that were available during my care of the patient were reviewed by me and considered in my medical decision making (see chart for details).   Patient is a nontoxic-appearing 32 year old female presenting for evaluation of sore throat and cough as outlined HPI above.  She denies any upper respiratory symptoms though on exam she does have inflamed nasal mucosa with clear rhinorrhea patient also has erythema and edema to her bilateral tonsillar pillars and posterior oropharynx with clear postnasal drip.  No exudate appreciated.  Her daughter did recently have strep but she is not aware of any other sick contacts.  I will order a strep PCR.  Strep PCR is negative.  I will discharge patient home with a diagnosis of viral URI and prescribe Atrovent nasal spray to help with the nasal congestion and postnasal drip which I think is feeding the patient's cough.  I will also prescribe Tessalon Perles and Promethazine DM cough syrup to help with cough and congestion.   Final Clinical Impressions(s) / UC Diagnoses   Final diagnoses:  Viral URI with cough     Discharge Instructions      Your strep test is negative but your physical exam does reveal signs of an upper respiratory infection and is most likely viral.  Please use over-the-counter Tylenol and/or ibuprofen according to the package instructions as needed for any pain or fever you may develop.  Use the Atrovent nasal spray, 2 squirts in each nostril every 6 hours, as needed for runny nose and postnasal drip.  Use the Tessalon Perles every 8 hours during the day.  Take them with a small sip of water.  They may give you some numbness  to the base of your tongue or a metallic taste in your mouth, this is normal.  Use the Promethazine DM cough syrup at bedtime for cough and congestion.  It will make you drowsy so do not take it during the day.  Return for reevaluation or see your primary care provider for any new or worsening symptoms.      ED Prescriptions     Medication Sig Dispense Auth. Provider   benzonatate (TESSALON) 100 MG capsule Take 2 capsules (200 mg total) by mouth every 8 (eight) hours. 21 capsule Becky Augusta, NP   ipratropium (ATROVENT) 0.06 % nasal spray Place 2 sprays into both nostrils 4 (four) times daily. 15 mL Becky Augusta, NP   promethazine-dextromethorphan (PROMETHAZINE-DM) 6.25-15 MG/5ML syrup Take 5 mLs by mouth 4 (four) times daily as needed. 118 mL Becky Augusta, NP      PDMP not reviewed this encounter.   Becky Augusta, NP 12/27/22 1910

## 2023-02-19 DIAGNOSIS — Z1329 Encounter for screening for other suspected endocrine disorder: Secondary | ICD-10-CM | POA: Diagnosis not present

## 2023-02-19 DIAGNOSIS — K219 Gastro-esophageal reflux disease without esophagitis: Secondary | ICD-10-CM | POA: Insufficient documentation

## 2023-02-19 DIAGNOSIS — Z130 Encounter for screening for diseases of the blood and blood-forming organs and certain disorders involving the immune mechanism: Secondary | ICD-10-CM | POA: Diagnosis not present

## 2023-02-19 DIAGNOSIS — Z8249 Family history of ischemic heart disease and other diseases of the circulatory system: Secondary | ICD-10-CM | POA: Diagnosis not present

## 2023-02-19 DIAGNOSIS — E7849 Other hyperlipidemia: Secondary | ICD-10-CM | POA: Diagnosis not present

## 2023-02-19 DIAGNOSIS — Z0000 Encounter for general adult medical examination without abnormal findings: Secondary | ICD-10-CM | POA: Diagnosis not present

## 2023-02-19 DIAGNOSIS — Z131 Encounter for screening for diabetes mellitus: Secondary | ICD-10-CM | POA: Diagnosis not present

## 2023-02-19 DIAGNOSIS — Z1331 Encounter for screening for depression: Secondary | ICD-10-CM | POA: Diagnosis not present

## 2023-03-14 ENCOUNTER — Encounter: Payer: Self-pay | Admitting: Podiatry

## 2023-03-14 ENCOUNTER — Ambulatory Visit: Payer: 59 | Admitting: Podiatry

## 2023-03-14 ENCOUNTER — Ambulatory Visit (INDEPENDENT_AMBULATORY_CARE_PROVIDER_SITE_OTHER): Payer: 59

## 2023-03-14 DIAGNOSIS — M778 Other enthesopathies, not elsewhere classified: Secondary | ICD-10-CM

## 2023-03-14 DIAGNOSIS — R2689 Other abnormalities of gait and mobility: Secondary | ICD-10-CM | POA: Diagnosis not present

## 2023-03-14 DIAGNOSIS — R200 Anesthesia of skin: Secondary | ICD-10-CM | POA: Diagnosis not present

## 2023-03-14 NOTE — Progress Notes (Signed)
Subjective:  Patient ID: Traci Petersen, female    DOB: [birth year],  MRN: [id number] HPI Chief Complaint  Patient presents with   Foot Pain    Bilateral feet - reconstructive surgery for flat foot and bunion when she was about 32 yo, recently having numbness in both feet when walking making her feel unsteady, also 2nd toes bilateral tender with walking as well   New Patient (Initial Visit)    32 y.o. female presents with the above complaint.   ROS: Denies fever chills nausea vomit muscle aches pains calf pain back pain chest pain shortness of breath.  She states that her feet go numb 1 at a time as she walks and this only started a couple of weeks ago stating that she has had no changes in her medications no changes in her shoe gear or allergies.  She states that this causes her to lose her balance.  She denies any changes in her past medical history medications allergies surgery social history.  Past Medical History:  Diagnosis Date   Bunion    Chlamydia    Elevated cholesterol    Vaginal Pap smear, abnormal    Past Surgical History:  Procedure Laterality Date   FOOT SURGERY      Current Outpatient Medications:    atorvastatin (LIPITOR) 40 MG tablet, Take 40 mg by mouth daily., Disp: , Rfl:    ipratropium (ATROVENT) 0.06 % nasal spray, Place 2 sprays into both nostrils 4 (four) times daily., Disp: 15 mL, Rfl: 12   rosuvastatin (CRESTOR) 40 MG tablet, Take 40 mg by mouth daily., Disp: , Rfl:   Current Facility-Administered Medications:    medroxyPROGESTERone (DEPO-PROVERA) injection 150 mg, 150 mg, Intramuscular, Once, Sciora, Elizabeth A, CNM   medroxyPROGESTERone (DEPO-PROVERA) injection 150 mg, 150 mg, Intramuscular, Q90 days, [place], [place], [place], 150 mg at 10/02/22 1400  Allergies  Allergen Reactions   Pantoprazole Hives and Swelling    Also had facial swelling, throat swelling, and hand swelling   Pantoprazole Sodium Hives, Itching and Swelling   Review of  Systems Objective:  There were no vitals filed for this visit.  General: Well developed, nourished, in no acute distress, alert and oriented x3   Dermatological: Skin is warm, dry and supple bilateral. Nails x 10 are well maintained; remaining integument appears unremarkable at this time. There are no open sores, no preulcerative lesions, no rash or signs of infection present.  Vascular: Dorsalis Pedis artery and Posterior Tibial artery pedal pulses are 2/4 bilateral with immedate capillary fill time. Pedal hair growth present. No varicosities and no lower extremity edema present bilateral.   Neruologic: Grossly intact via light touch bilateral. Vibratory intact via tuning fork bilateral. Protective threshold with Semmes Wienstein monofilament intact to all pedal sites bilateral. Patellar and Achilles deep tendon reflexes 2+ bilateral. No Babinski or clonus noted bilateral.   Musculoskeletal: No gross boney pedal deformities bilateral. No pain, crepitus, or limitation noted with foot and ankle range of motion bilateral. Muscular strength 5/5 in all groups tested bilateral.  Gait: Unassisted, Nonantalgic.    Radiographs:  Radiographs taken today demonstrate osseously mature foot K wires retained for the first metatarsal osteotomy for bunion correction she has had flatfoot reconstruction with no fixation for that.  Assessment & Plan:   Assessment: Cannot rule out some type of neuropathy associated with this I am concerned about organic disease such as MS.  Plan: Plan is to send her for neurologic evaluation to rule out any significant disabling neurological issues.  I explained to her that she may end up having to go through nerve conduction test EMGs MRIs etc. she understands this and is amenable to it would like to get to the bottom of this.  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     Romy Mcgue T. [place], [place]

## 2023-04-03 ENCOUNTER — Ambulatory Visit: Payer: 59 | Admitting: Family Medicine

## 2023-04-03 DIAGNOSIS — Z708 Other sex counseling: Secondary | ICD-10-CM

## 2023-04-03 DIAGNOSIS — Z113 Encounter for screening for infections with a predominantly sexual mode of transmission: Secondary | ICD-10-CM

## 2023-04-03 DIAGNOSIS — Z114 Encounter for screening for human immunodeficiency virus [HIV]: Secondary | ICD-10-CM

## 2023-04-03 LAB — HM HIV SCREENING LAB: HM HIV Screening: NEGATIVE

## 2023-04-03 NOTE — Progress Notes (Signed)
Attestation of [facility]: Evaluation and management procedures were performed by the Registered Nurse under [facility] standing orders.  RN independently saw the patient and provided screenings for them without medical exam. Patient self collected specimens. I have reviewed the RN's note and chart, and I agree with screening ordered.   Helena Middleton, FNP  

## 2023-04-03 NOTE — Progress Notes (Signed)
STI clinic/screening visit [address] [place] [address] [phone]  Subjective:  Traci Petersen is a 32 y.o. female being seen today for [facility]. The patient reports they do not have symptoms.    Patient's last menstrual period was 02/11/2023.  Patient has the following medical conditions:   Patient Active Problem List   Diagnosis Date Noted   Gastroesophageal reflux disease 02/19/2023   Abnormal Pap smear of cervix 05/03/21 neg HPV+ 08/11/2021   Hypercholesterolemia 11/02/2019    Chief Complaint  Patient presents with   SEXUALLY TRANSMITTED DISEASE    No symptoms    Does the patient using douching products? No  Last HIV test per patient/review of record was  Lab Results  Component Value Date   HMHIVSCREEN Negative - Validated 12/27/2022    Lab Results  Component Value Date   HIV non reactive 10/27/2020   Patient reports last pap was  Lab Results  Component Value Date   DIAGPAP  05/08/2022    - Negative for intraepithelial lesion or malignancy (NILM)   No results found for: "SPECADGYN"  Screening for MPX risk: Does the patient have an unexplained rash? No Is the patient MSM? No Does the patient endorse multiple sex partners or anonymous sex partners? No Did the patient have close or sexual contact with a person diagnosed with MPX? No Has the patient traveled outside the [place] where MPX is endemic? No Is there a high clinical suspicion for MPX-- evidenced by one of the following No  -Unlikely to be chickenpox  -Lymphadenopathy  -Rash that present in same phase of evolution on any given body part See flowsheet for further details and programmatic requirements.   Immunization history:  Immunization History  Administered Date(s) Administered   Hepatitis B 05/23/2001, 06/27/2001, 12/05/2001   Hpv-Unspecified 02/15/2006, 08/08/2007, 10/26/2008   Influenza Inj Mdck Quad Pf 07/13/2016   Influenza,inj,Quad PF,6+ Mos 04/21/2020    Influenza-Unspecified 07/13/2016, 04/13/2020   MMR 05/19/1991, 06/11/1994   Moderna Sars-Covid-2 Vaccination 07/16/2020, 08/14/2020   Tdap 10/26/2008, 08/25/2014   Varicella 05/30/2007, 10/26/2008, 01/08/2015     The following portions of the patient's history were reviewed and updated as appropriate: allergies, current medications, past medical history, past social history, past surgical history and problem list.  Objective:  There were no vitals filed for this visit.  Patient seen by RN only. Self collected swabs.    Assessment and Plan:  Traci Petersen is a 32 y.o. female presenting to the [facility] for STI screening in [facility]  1. Screening examination for venereal disease  - Chlamydia/Gonorrhea [place] [facility] - HIV [place] [facility] - Syphilis Serology, [facility] - Chlamydia/Gonorrhea [place] [facility]   Patient accepted all screenings including oral, vaginal CT/GC and bloodwork for HIV/RPR. Patient meets criteria for HepB screening? No. Ordered? no Patient meets criteria for HepC screening? No. Ordered? no  Treat positive results per standing order.  Discussed time line for [facility] results and that patient will be called with positive results and encouraged patient to call if she had not heard in 2 weeks.  Recommended repeat testing in 3 months with positive results. Recommended condom use with all sex  Patient is currently using  none  to prevent pregnancy.    No follow-ups on file.  Future Appointments  Date Time Provider Department Center  06/11/2023  9:30 AM Penumalli, Glenford Bayley, MD [id number] None    Gaspar Garbe, RN

## 2023-05-27 ENCOUNTER — Ambulatory Visit: Payer: 59

## 2023-05-29 DIAGNOSIS — R0789 Other chest pain: Secondary | ICD-10-CM | POA: Diagnosis not present

## 2023-05-29 DIAGNOSIS — R519 Headache, unspecified: Secondary | ICD-10-CM | POA: Diagnosis not present

## 2023-05-29 DIAGNOSIS — Z03818 Encounter for observation for suspected exposure to other biological agents ruled out: Secondary | ICD-10-CM | POA: Diagnosis not present

## 2023-06-11 ENCOUNTER — Ambulatory Visit (INDEPENDENT_AMBULATORY_CARE_PROVIDER_SITE_OTHER): Payer: 59 | Admitting: Diagnostic Neuroimaging

## 2023-06-11 ENCOUNTER — Encounter: Payer: Self-pay | Admitting: Diagnostic Neuroimaging

## 2023-06-11 VITALS — BP 109/72 | HR 61 | Ht 67.0 in | Wt 182.0 lb

## 2023-06-11 DIAGNOSIS — R200 Anesthesia of skin: Secondary | ICD-10-CM

## 2023-06-11 NOTE — Progress Notes (Signed)
[facility]  PATIENT: Traci Petersen DOB: Nov 28, 1990  REFERRING CLINICIAN: Hyatt, Max T, DPM HISTORY FROM: patient  REASON FOR VISIT: new consult   HISTORICAL  CHIEF COMPLAINT:  Chief Complaint  Patient presents with   Numbness    Rm 6 alone Pt is well, reports she has had numbness in L foot that is causing her to loose balance for about 6 months. Occasionally, her legs will start to tingling and get numb but it is at random times.     HISTORY OF PRESENT ILLNESS:   32 year old female here for evaluation of left foot numbness.  Over the past 6 months has had 5 episodes lasting 1 minute each of left foot numbness on the top of the left foot.  No other associated symptoms.  No weakness.  No neck or low back pain.  No problems with arms, face, speech, vision, swallowing.   REVIEW OF SYSTEMS: Full 14 system review of systems performed and negative with exception of: as per HPI.  ALLERGIES: Allergies  Allergen Reactions   Pantoprazole Hives and Swelling    Also had facial swelling, throat swelling, and hand swelling   Pantoprazole Sodium Hives, Itching and Swelling    HOME MEDICATIONS: Outpatient Medications Prior to Visit  Medication Sig Dispense Refill   atorvastatin (LIPITOR) 40 MG tablet Take 40 mg by mouth daily.     rosuvastatin (CRESTOR) 40 MG tablet Take 40 mg by mouth daily.     ipratropium (ATROVENT) 0.06 % nasal spray Place 2 sprays into both nostrils 4 (four) times daily. (Patient not taking: Reported on 04/03/2023) 15 mL 12   Facility-Administered Medications Prior to Visit  Medication Dose Route Frequency Provider Last Rate Last Admin   medroxyPROGESTERone (DEPO-PROVERA) injection 150 mg  150 mg Intramuscular Once Sciora, Elizabeth A, CNM       medroxyPROGESTERone (DEPO-PROVERA) injection 150 mg  150 mg Intramuscular Q90 days Lenice Llamas, FNP   150 mg at 10/02/22 1400    PAST MEDICAL HISTORY: Past Medical History:  Diagnosis Date    Bunion    Chlamydia    Elevated cholesterol    Vaginal Pap smear, abnormal     PAST SURGICAL HISTORY: Past Surgical History:  Procedure Laterality Date   FOOT SURGERY Bilateral 2004    FAMILY HISTORY: Family History  Problem Relation Age of Onset   Heart disease Father    Hypertension Father    Alcohol abuse Maternal Grandfather    Breast cancer Paternal Grandmother    Hypertension Paternal Grandmother    Alcohol abuse Paternal Grandfather    Heart murmur Daughter    Down syndrome Daughter     SOCIAL HISTORY: Social History   Socioeconomic History   Marital status: Single    Spouse name: Not on file   Number of children: Not on file   Years of education: Not on file   Highest education level: Not on file  Occupational History   Not on file  Tobacco Use   Smoking status: Never    Passive exposure: Past   Smokeless tobacco: Never   Tobacco comments:    Passive smoke exposure in childhood.  Vaping Use   Vaping status: Never Used  Substance and Sexual Activity   Alcohol use: Yes    Alcohol/week: 1.0 standard drink of alcohol    Types: 1 Glasses of wine per week    Comment: occasionally   Drug use: No   Sexual activity: Yes    Partners: Male  Birth control/protection: Condom  Other Topics Concern   Not on file  Social History Narrative   Not on file   Social Determinants of Health   Financial Resource Strain: Patient Declined (02/19/2023)   Received [facility]   Overall Financial Resource Strain (CARDIA)    Difficulty of Paying Living Expenses: Patient declined  Food Insecurity: Patient Declined (02/19/2023)   Received [facility]   Hunger Vital Sign    Worried About Running Out of Food in the Last Year: Patient declined    Ran Out of Food in the Last Year: Patient declined  Transportation Needs: Patient Declined (02/19/2023)   Received from [facility] - Transportation    In the  past 12 months, has lack of transportation kept you from medical appointments or from getting medications?: Patient declined    Lack of Transportation (Non-Medical): Patient declined  Physical Activity: Not on file  Stress: Not on file  Social Connections: Not on file  Intimate Partner Violence: Not At Risk (07/31/2022)   Humiliation, Afraid, Rape, and Kick questionnaire    Fear of Current or Ex-Partner: No    Emotionally Abused: No    Physically Abused: No    Sexually Abused: No     PHYSICAL EXAM  GENERAL EXAM/CONSTITUTIONAL: Vitals:  Vitals:   06/11/23 0913  BP: 109/72  Pulse: 61  Weight: 182 lb (82.6 kg)  Height: 5\' 7"  (1.702 m)   Body mass index is 28.51 kg/m. Wt Readings from Last 3 Encounters:  06/11/23 182 lb (82.6 kg)  10/02/22 181 lb 4 oz (82.2 kg)  07/17/22 183 lb (83 kg)   Patient is in no distress; well developed, nourished and groomed; neck is supple  CARDIOVASCULAR: Examination of carotid arteries is normal; no carotid bruits Regular rate and rhythm, no murmurs Examination of peripheral vascular system by observation and palpation is normal  EYES: Ophthalmoscopic exam of optic discs and posterior segments is normal; no papilledema or hemorrhages No results found.  MUSCULOSKELETAL: Gait, strength, tone, movements noted in Neurologic exam below  NEUROLOGIC: MENTAL STATUS:      No data to display         awake, alert, oriented to person, place and time recent and remote memory intact normal attention and concentration language fluent, comprehension intact, naming intact fund of knowledge appropriate  CRANIAL NERVE:  2nd - no papilledema on fundoscopic exam 2nd, 3rd, 4th, 6th - pupils equal and reactive to light, visual fields full to confrontation, extraocular muscles intact, no nystagmus 5th - facial sensation symmetric 7th - facial strength symmetric 8th - hearing intact 9th - palate elevates symmetrically, uvula midline 11th - shoulder  shrug symmetric 12th - tongue protrusion midline  MOTOR:  normal bulk and tone, full strength in the BUE, BLE  SENSORY:  normal and symmetric to light touch, pinprick, temperature, vibration  COORDINATION:  finger-nose-finger, fine finger movements normal  REFLEXES:  deep tendon reflexes TRACE and symmetric  GAIT/STATION:  narrow based gait     DIAGNOSTIC DATA (LABS, IMAGING, TESTING) - I reviewed patient records, labs, notes, testing and imaging myself where available.  Lab Results  Component Value Date   WBC 7.6 10/02/2020   HGB 12.9 10/02/2020   HCT 38.0 10/02/2020   MCV 92.2 10/02/2020   PLT 246 10/02/2020      Component Value Date/Time   NA 136 10/02/2020 0331   NA 137 07/27/2014 0401   K 3.8 10/02/2020 0331  K 3.8 07/27/2014 0401   CL 105 10/02/2020 0331   CL 104 07/27/2014 0401   CO2 24 10/02/2020 0331   CO2 25 07/27/2014 0401   GLUCOSE 102 (H) 10/02/2020 0331   GLUCOSE 84 07/27/2014 0401   BUN 14 10/02/2020 0331   BUN 8 07/27/2014 0401   CREATININE 0.87 10/02/2020 0331   CREATININE 0.69 07/27/2014 0401   CALCIUM 8.7 (L) 10/02/2020 0331   CALCIUM 8.9 07/27/2014 0401   PROT 6.9 07/27/2014 0401   ALBUMIN 3.0 (L) 07/27/2014 0401   AST 6 (L) 07/27/2014 0401   ALT 12 (L) 07/27/2014 0401   ALKPHOS 59 07/27/2014 0401   BILITOT 0.2 07/27/2014 0401   GFRNONAA >60 10/02/2020 0331   GFRNONAA >60 07/27/2014 0401   GFRNONAA >60 04/26/2012 1536   GFRAA >60 07/27/2014 0401   GFRAA >60 04/26/2012 1536   Lab Results  Component Value Date   CHOL 235 (H) 04/27/2020   HDL 58 04/27/2020   LDLCALC 162 (H) 04/27/2020   TRIG 87 04/27/2020   CHOLHDL 4.1 04/27/2020   No results found for: "HGBA1C" No results found for: "VITAMINB12" No results found for: "TSH"      ASSESSMENT AND PLAN  32 y.o. year old female here with:   Dx:  1. Numbness of left foot     PLAN:  INTERMITTENT NUMBNESS (top of left foot; 1 minute per event; 5 events since last 6  months; suspect mild intermittent nerve irritation / compression in the left foot / ankle) - neurologic exam is normal today; no symptoms currently; EMG/NCS unlikely to be helpful in this situation - no other signs of symptoms to suggest a widespread CNS disorder such as multiple sclerosis at this time; recommend to monitor; if other symptoms / locations get affected, then could consider additional testing  Return for return to referring provider, pending if symptoms worsen or fail to improve.    Suanne Marker, MD 06/11/2023, 10:11 AM Certified in Neurology, Neurophysiology and Neuroimaging  [facility] [address], [address] [place], [place] [address] [phone]

## 2023-06-11 NOTE — Patient Instructions (Signed)
  INTERMITTENT NUMBNESS (top of left foot; 1 minute per event; 5 events since last 6 months; suspect mild intermittent nerve irritation / compression in the left foot / ankle) - neurologic exam is normal today; no symptoms currently; EMG/NCS unlikely to be helpful in this situation - no other signs of symptoms to suggest a widespread CNS disorder such as multiple sclerosis at this time; recommend to monitor; if other symptoms / locations get affected, then could consider additional testing

## 2023-07-25 ENCOUNTER — Ambulatory Visit: Payer: 59 | Admitting: Family Medicine

## 2023-07-25 DIAGNOSIS — Z113 Encounter for screening for infections with a predominantly sexual mode of transmission: Secondary | ICD-10-CM

## 2023-07-25 LAB — HM HIV SCREENING LAB: HM HIV Screening: NEGATIVE

## 2023-07-25 LAB — WET PREP FOR TRICH, YEAST, CLUE
Trichomonas Exam: NEGATIVE
Yeast Exam: NEGATIVE

## 2023-07-25 NOTE — Progress Notes (Signed)
[facility] Department  STI clinic/screening visit [address] [place] [address] [phone]  Subjective:  Traci Petersen is a 32 y.o. female being seen today for an STI screening visit. The patient reports they do not have symptoms.  Patient reports that they do not desire a pregnancy in the next year.   They reported they are not interested in discussing contraception today.    No LMP recorded.  Patient has the following medical conditions:   Patient Active Problem List   Diagnosis Date Noted   Gastroesophageal reflux disease 02/19/2023   Abnormal Pap smear of cervix 05/03/21 neg HPV+ 08/11/2021   Hypercholesterolemia 11/02/2019    Chief Complaint  Patient presents with   SEXUALLY TRANSMITTED DISEASE    Pt is here for screening and has no symptoms    HPI  Patient reports to clinic for screening for STI- asymptomatic  Does the patient using douching products? No  Last HIV test per patient/review of record was  Lab Results  Component Value Date   HMHIVSCREEN Negative - Validated 04/03/2023    Lab Results  Component Value Date   HIV non reactive 10/27/2020     Last HEPC test per patient/review of record was No results found for: "HMHEPCSCREEN" No components found for: "HEPC"   Last HEPB test per patient/review of record was No components found for: "HMHEPBSCREEN" No components found for: "HEPC"   Patient reports last pap was  Lab Results  Component Value Date   DIAGPAP  05/08/2022    - Negative for intraepithelial lesion or malignancy (NILM)   No results found for: "SPECADGYN"  Screening for MPX risk: Does the patient have an unexplained rash? No Is the patient MSM? No Does the patient endorse multiple sex partners or anonymous sex partners? No Did the patient have close or sexual contact with a person diagnosed with MPX? No Has the patient traveled outside the [place] where MPX is endemic? No Is there a high clinical suspicion for  MPX-- evidenced by one of the following No  -Unlikely to be chickenpox  -Lymphadenopathy  -Rash that present in same phase of evolution on any given body part See flowsheet for further details and programmatic requirements.   Immunization history:  Immunization History  Administered Date(s) Administered   Hepatitis B 05/23/2001, 06/27/2001, 12/05/2001   Hpv-Unspecified 02/15/2006, 08/08/2007, 10/26/2008   Influenza Inj Mdck Quad Pf 07/13/2016   Influenza,inj,Quad PF,6+ Mos 04/21/2020   Influenza-Unspecified 07/13/2016, 04/13/2020   MMR 05/19/1991, 06/11/1994   Moderna Sars-Covid-2 Vaccination 07/16/2020, 08/14/2020   Tdap 10/26/2008, 08/25/2014   Varicella 05/30/2007, 10/26/2008, 01/08/2015     The following portions of the patient's history were reviewed and updated as appropriate: allergies, current medications, past medical history, past social history, past surgical history and problem list.  Objective:  There were no vitals filed for this visit.  Physical Exam Vitals and nursing note reviewed.  Constitutional:      Appearance: Normal appearance.  HENT:     Head: Normocephalic and atraumatic.     Mouth/Throat:     Mouth: Mucous membranes are moist.     Pharynx: Oropharynx is clear. No oropharyngeal exudate or posterior oropharyngeal erythema.  Pulmonary:     Effort: Pulmonary effort is normal.  Abdominal:     General: Abdomen is flat.     Palpations: There is no mass.     Tenderness: There is no abdominal tenderness. There is no rebound.  Genitourinary:    Comments: Declined genital exam-  self swabbed Lymphadenopathy:     Head:     Right side of head: No preauricular or posterior auricular adenopathy.     Left side of head: No preauricular or posterior auricular adenopathy.     Cervical: No cervical adenopathy.     Upper Body:     Right upper body: No supraclavicular, axillary or epitrochlear adenopathy.     Left upper body: No supraclavicular, axillary or  epitrochlear adenopathy.  Skin:    General: Skin is warm and dry.     Findings: No rash.  Neurological:     Mental Status: She is alert and oriented to person, place, and time.      Assessment and Plan:  Traci Petersen is a 32 y.o. female [facility] for STI screening  1. Screening for venereal disease (Primary)  - Chlamydia/Gonorrhea [place] [facility] - HIV [place] [facility] - Syphilis Serology,  Lab - WET PREP FOR TRICH, YEAST, CLUE   Patient accepted all screenings including vaginal CT/GC and bloodwork for HIV/RPR, and wet prep. Patient meets criteria for HepB screening? No. Ordered? not applicable Patient meets criteria for HepC screening? No. Ordered? not applicable  Treat wet prep per standing order Discussed time line for State Lab results and that patient will be called with positive results and encouraged patient to call if she had not heard in 2 weeks.  Counseled to return or seek care for continued or worsening symptoms Recommended repeat testing in 3 months with positive results. Recommended condom use with all sex  Patient is currently using  nothing  to prevent pregnancy.    Return if symptoms worsen or fail to improve, for STI screening.  No future appointments.  Lenice Llamas, [place]

## 2023-07-25 NOTE — Progress Notes (Signed)
Pt is here for STD screening.  Wet mount results reviewed, no treatment required per SO.  Condoms declined.  Trista Ciocca M Mickeal Daws, RN  

## 2023-08-22 DIAGNOSIS — S0181XA Laceration without foreign body of other part of head, initial encounter: Secondary | ICD-10-CM | POA: Insufficient documentation

## 2023-08-22 DIAGNOSIS — S0183XA Puncture wound without foreign body of other part of head, initial encounter: Secondary | ICD-10-CM | POA: Diagnosis not present

## 2023-08-22 DIAGNOSIS — S0185XA Open bite of other part of head, initial encounter: Secondary | ICD-10-CM | POA: Diagnosis not present

## 2023-08-22 DIAGNOSIS — W540XXA Bitten by dog, initial encounter: Secondary | ICD-10-CM | POA: Insufficient documentation

## 2023-08-23 ENCOUNTER — Emergency Department
Admission: EM | Admit: 2023-08-23 | Discharge: 2023-08-23 | Disposition: A | Discharge status: Home / Self Care | Payer: 59 | Attending: Emergency Medicine | Admitting: Emergency Medicine

## 2023-08-23 ENCOUNTER — Other Ambulatory Visit: Payer: Self-pay

## 2023-08-23 DIAGNOSIS — W540XXA Bitten by dog, initial encounter: Secondary | ICD-10-CM

## 2023-08-23 DIAGNOSIS — S0185XA Open bite of other part of head, initial encounter: Secondary | ICD-10-CM

## 2023-08-23 MED ORDER — BACITRACIN ZINC 500 UNIT/GM EX OINT
TOPICAL_OINTMENT | Freq: Once | CUTANEOUS | Status: AC
  Administered 2023-08-23: 1 via TOPICAL
  Filled 2023-08-23: qty 0.9

## 2023-08-23 MED ORDER — HYDROCODONE-ACETAMINOPHEN 5-325 MG PO TABS
1.0000 | ORAL_TABLET | Freq: Once | ORAL | Status: AC
Start: 2023-08-23 — End: 2023-08-23
  Administered 2023-08-23: 1 via ORAL
  Filled 2023-08-23: qty 1

## 2023-08-23 MED ORDER — AMOXICILLIN-POT CLAVULANATE 875-125 MG PO TABS
1.0000 | ORAL_TABLET | Freq: Once | ORAL | Status: AC
  Administered 2023-08-23: 1 via ORAL
  Filled 2023-08-23: qty 1

## 2023-08-23 MED ORDER — IBUPROFEN 800 MG PO TABS
800.0000 mg | ORAL_TABLET | Freq: Once | ORAL | Status: AC
  Administered 2023-08-23: 800 mg via ORAL
  Filled 2023-08-23: qty 1

## 2023-08-23 MED ORDER — AMOXICILLIN-POT CLAVULANATE 875-125 MG PO TABS: 1.0000 | ORAL_TABLET | Freq: Two times a day (BID) | ORAL | 0 refills | Status: DC

## 2023-08-23 MED ORDER — HYDROCODONE-ACETAMINOPHEN 5-325 MG PO TABS: 1.0000 | ORAL_TABLET | Freq: Four times a day (QID) | ORAL | 0 refills | Status: DC | PRN

## 2023-08-23 NOTE — ED Provider Notes (Signed)
 [facility] Provider Note    Event Date/Time   First MD Initiated Contact with Patient 08/23/23 0150     (approximate)   History   Animal Bite   HPI  Traci Petersen is a 33 y.o. female who presents to the ED from home with a chief complaint of dog bite.  Patient's American bully dog has been sick and she was trying to encourage it to eat when it bit her face and hand.  Presents with puncture wounds to right chin and thumb.  Voices no other complaints or injuries.  Dog is up-to-date with rabies shots.  Patient is up-to-date with tetanus.     Past Medical History   Past Medical History:  Diagnosis Date   Bunion    Chlamydia    Elevated cholesterol    Vaginal Pap smear, abnormal      Active Problem List   Patient Active Problem List   Diagnosis Date Noted   Gastroesophageal reflux disease 02/19/2023   Abnormal Pap smear of cervix 05/03/21 neg HPV+ 08/11/2021   Hypercholesterolemia 11/02/2019     Past Surgical History   Past Surgical History:  Procedure Laterality Date   FOOT SURGERY Bilateral 2004     Home Medications   Prior to Admission medications   Medication Sig Start Date End Date Taking? Authorizing Provider  amoxicillin -clavulanate (AUGMENTIN ) 875-125 MG tablet Take 1 tablet by mouth 2 (two) times daily. 08/23/23  Yes Robinette Vermell PARAS, MD  HYDROcodone -acetaminophen  (NORCO) 5-325 MG tablet Take 1 tablet by mouth every 6 (six) hours as needed for moderate pain (pain score 4-6). 08/23/23  Yes Rosslyn Pasion J, MD  atorvastatin (LIPITOR) 40 MG tablet Take 40 mg by mouth daily. 04/09/19   [provider]  rosuvastatin (CRESTOR) 40 MG tablet Take 40 mg by mouth daily.    [provider]     Allergies  Pantoprazole  and Pantoprazole  sodium   Family History   Family History  Problem Relation Age of Onset   Heart disease Father    Hypertension Father    Alcohol abuse Maternal Grandfather    Breast cancer Paternal  Grandmother    Hypertension Paternal Grandmother    Alcohol abuse Paternal Grandfather    Heart murmur Daughter    Down syndrome Daughter      Physical Exam  Triage Vital Signs: ED Triage Vitals  Encounter Vitals Group     BP 08/23/23 0002 129/87     Systolic BP Percentile --      Diastolic BP Percentile --      Pulse Rate 08/23/23 0002 63     Resp 08/23/23 0002 16     Temp 08/23/23 0002 98.4 F (36.9 C)     Temp Source 08/23/23 0002 Oral     SpO2 08/23/23 0002 100 %     Weight 08/23/23 0001 189 lb (85.7 kg)     Height 08/23/23 0001 5' 6 (1.676 m)     Head Circumference --      Peak Flow --      Pain Score 08/23/23 0001 3     Pain Loc --      Pain Education --      Exclude from Growth Chart --     Updated Vital Signs: BP 111/74   Pulse (!) 58   Temp 98.6 F (37 C) (Oral)   Resp 16   Ht 5' 6 (1.676 m)   Wt 85.7 kg   LMP 07/31/2023 (Exact Date)  SpO2 98%   BMI 30.51 kg/m    General: Awake, no distress.  CV:  Good peripheral perfusion.  Resp:  Normal effort.  Abd:  No distention.  Other:  Scattered minor superficial lacerations and puncture wounds to right lower chin with mild surrounding swelling.  Base of right thumb with puncture wound without swelling.  Full range of motion thumb.   ED Results / Procedures / Treatments  Labs (all labs ordered are listed, but only abnormal results are displayed) Labs Reviewed - No data to display   EKG  None   RADIOLOGY None   Official radiology report(s): No results found.   PROCEDURES:  Critical Care performed: No  Procedures   MEDICATIONS ORDERED IN ED: Medications  bacitracin  ointment (1 Application Topical Given 08/23/23 0206)  HYDROcodone -acetaminophen  (NORCO/VICODIN) 5-325 MG per tablet 1 tablet (1 tablet Oral Given 08/23/23 0206)  ibuprofen  (ADVIL ) tablet 800 mg (800 mg Oral Given 08/23/23 0206)  amoxicillin -clavulanate (AUGMENTIN ) 875-125 MG per tablet 1 tablet (1 tablet Oral Given 08/23/23  0206)     IMPRESSION / MDM / ASSESSMENT AND PLAN / ED COURSE  I reviewed the triage vital signs and the nursing notes.                             33 year old female presenting with dog bites to face and thumb.  Will cleanse thoroughly, apply bacitracin , start Augmentin ; NSAID and Norco given for pain control.  Patient will follow closely with her PCP.  Strict return precautions given.  Patient verbalizes understanding and agrees with plan of care.  Patient's presentation is most consistent with acute, uncomplicated illness.   FINAL CLINICAL IMPRESSION(S) / ED DIAGNOSES   Final diagnoses:  Dog bite of face, initial encounter  Dog bite, initial encounter     Rx / DC Orders   ED Discharge Orders          Ordered    amoxicillin -clavulanate (AUGMENTIN ) 875-125 MG tablet  2 times daily        08/23/23 0208    HYDROcodone -acetaminophen  (NORCO) 5-325 MG tablet  Every 6 hours PRN        08/23/23 0208             Note:  This document was prepared using Dragon voice recognition software and may include unintentional dictation errors.   Jadah Bobak J, MD 08/23/23 [phone]

## 2023-08-23 NOTE — Discharge Instructions (Signed)
 Take and finish antibiotic as prescribed.  You may take ibuprofen as needed for pain; Norco as needed for more severe pain.  Return to the ER for worsening symptoms, increased redness/swelling, purulent discharge, fever or other concerns.

## 2023-08-23 NOTE — ED Notes (Signed)
Patient given discharge instructions including prescriptions x2 and importance of follow up appt as needed with stated understanding. Patient stable and ambulatory with steady even gait on dispo.

## 2023-08-23 NOTE — ED Triage Notes (Signed)
 Pt to ED via POV c/o animal bite tonight. Pt reports her dog bit her. Pt has 4 puncture wounds to right cheek and one to right thumb. Dog UTD with rabies.

## 2023-08-28 DIAGNOSIS — W540XXD Bitten by dog, subsequent encounter: Secondary | ICD-10-CM | POA: Diagnosis not present

## 2023-08-28 DIAGNOSIS — S0185XD Open bite of other part of head, subsequent encounter: Secondary | ICD-10-CM | POA: Diagnosis not present

## 2023-11-06 ENCOUNTER — Ambulatory Visit: Admitting: Podiatry

## 2023-11-06 ENCOUNTER — Encounter: Payer: Self-pay | Admitting: Podiatry

## 2023-11-06 DIAGNOSIS — M205X1 Other deformities of toe(s) (acquired), right foot: Secondary | ICD-10-CM

## 2023-11-06 DIAGNOSIS — M205X2 Other deformities of toe(s) (acquired), left foot: Secondary | ICD-10-CM

## 2023-11-06 DIAGNOSIS — M205X9 Other deformities of toe(s) (acquired), unspecified foot: Secondary | ICD-10-CM

## 2023-11-06 NOTE — Progress Notes (Signed)
 She presents today states that I think I like to take care of these toes to becoming more painful as time goes on is starting to affect my ability to wear shoes comfortably as she points to the second digits bilaterally.  She states that since the toes have been turned down it has been bothering the nails on a daily basis and it makes my nails dark and painful.  Objective: Vital signs stable alert oriented x 3.  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  Assessment mallet toe deformity second digit bilateral.  Plan: Discussed etiology pathology conservative versus surgical therapies.  At this point we consented her for a DIPJ arthroplasty mallet toe repair with tendinous interposition.  She understands the possible side effects and complications associated with this we did discuss the possible postop complications which may include but are not limited to postop pain bleeding swelling infection recurrence need for further surgery overcorrection under correction loss of digit loss limb loss of life.  Salter III pages of the consent form she was given information regarding surgery center and anesthesia we will follow-up with her in the near future for surgery.

## 2023-11-22 ENCOUNTER — Ambulatory Visit: Admitting: Family Medicine

## 2023-11-22 DIAGNOSIS — Z113 Encounter for screening for infections with a predominantly sexual mode of transmission: Secondary | ICD-10-CM

## 2023-11-22 DIAGNOSIS — B9689 Other specified bacterial agents as the cause of diseases classified elsewhere: Secondary | ICD-10-CM

## 2023-11-22 LAB — PREGNANCY, URINE: Preg Test, Ur: NEGATIVE

## 2023-11-22 LAB — HM HIV SCREENING LAB: HM HIV Screening: NEGATIVE

## 2023-11-22 LAB — WET PREP FOR TRICH, YEAST, CLUE
Trichomonas Exam: NEGATIVE
Yeast Exam: NEGATIVE

## 2023-11-22 MED ORDER — METRONIDAZOLE 500 MG PO TABS: 500.0000 mg | ORAL_TABLET | Freq: Two times a day (BID) | ORAL | Status: AC

## 2023-11-22 NOTE — Progress Notes (Addendum)
 Pt is here for STD visit.  Wet prep results reviewed with pt,  treatment required per provider order. The patient was dispensed metronidazole #!4 today. I provided counseling today regarding the medication. We discussed the medication, the side effects and when to call clinic. Patient given the opportunity to ask questions. Questions answered.   Condoms declined.  Gaspar Garbe, RN

## 2023-11-22 NOTE — Progress Notes (Signed)
 [facility] Department STI clinic [address] [place] [address] Main phone: [phone]  STI screening visit  Subjective:  Traci Petersen is a 33 y.o. female being seen today for an STI screening visit. The patient reports they do not have symptoms.  Patient reports that they are indifferent about  a pregnancy in the next year.   They reported they are not interested in discussing contraception today.   Patient's last menstrual period was 11/03/2023 (exact date).  Patient has the following medical conditions:  Patient Active Problem List   Diagnosis Date Noted   Gastroesophageal reflux disease 02/19/2023   Abnormal Pap smear of cervix 05/03/21 neg HPV+ 08/11/2021   Hypercholesterolemia 11/02/2019   Chief Complaint  Patient presents with   SEXUALLY TRANSMITTED DISEASE    No symptoms    HPI Patient reports she would like asymptomatic screening for STI. No concerns.   Does the patient using douching products? No  Last HIV test per patient/review of record was  Lab Results  Component Value Date   HMHIVSCREEN Negative - Validated 07/25/2023    Lab Results  Component Value Date   HIV non reactive 10/27/2020    Last HEPC test per patient/review of record was No results found for: "HMHEPCSCREEN" No components found for: "HEPC"   Last HEPB test per patient/review of record was No components found for: "HMHEPBSCREEN"   Patient reports last pap was:      Component Value Date/Time   DIAGPAP  05/08/2022 1457    - Negative for intraepithelial lesion or malignancy (NILM)   DIAGPAP  05/03/2021 1502    - Negative for intraepithelial lesion or malignancy (NILM)   HPVHIGH Negative 05/08/2022 1457   HPVHIGH Positive (A) 05/03/2021 1502   ADEQPAP  05/08/2022 1457    Satisfactory for evaluation; transformation zone component PRESENT.   ADEQPAP  05/03/2021 1502    Satisfactory for evaluation; transformation zone component PRESENT.   No  results found for: "SPECADGYN" Result Date Procedure Results Follow-ups  05/08/2022 Cytology - PAP High risk HPV: Negative Comment: Normal Reference Range Neisseria Gonorrhea - Negative Neisseria Gonorrhea: Negative Chlamydia: Negative Trichomonas: Negative Adequacy: Satisfactory for evaluation; transformation zone component PRESENT. Diagnosis: - Negative for intraepithelial lesion or malignancy (NILM) Comment: Normal Reference Range HPV - Negative Comment: Normal Reference Range Trichomonas - Negative Comment: Normal Reference Ranger Chlamydia - Negative   05/03/2021 Cytology - PAP High risk HPV: Positive (A) HPV 16: Negative HPV 18 / 45: Negative Adequacy: Satisfactory for evaluation; transformation zone component PRESENT. Diagnosis: - Negative for intraepithelial lesion or malignancy (NILM) Comment: Normal Reference Range HPV - Negative Comment: Normal Reference Range HPV 16 18 45 -Negative   11/22/2017 HM PAP SMEAR HM Pap smear: Negative    Screening for MPX risk: Does the patient have an unexplained rash? No Is the patient MSM? No Does the patient endorse multiple sex partners or anonymous sex partners? No Did the patient have close or sexual contact with a person diagnosed with MPX? No Has the patient traveled outside the [place] where MPX is endemic? No Is there a high clinical suspicion for MPX-- evidenced by one of the following No  -Unlikely to be chickenpox  -Lymphadenopathy  -Rash that present in same phase of evolution on any given body part See flowsheet for further details and programmatic requirements.   Immunization history:  Immunization History  Administered Date(s) Administered   Hepatitis B 05/23/2001, 06/27/2001, 12/05/2001   Hpv-Unspecified 02/15/2006, 08/08/2007, 10/26/2008   Influenza  Inj Mdck Quad Pf 07/13/2016   Influenza,inj,Quad PF,6+ Mos 04/21/2020   Influenza-Unspecified 07/13/2016, 04/13/2020   MMR 05/19/1991, 06/11/1994   Moderna Sars-Covid-2  Vaccination 07/16/2020, 08/14/2020   Tdap 10/26/2008, 08/25/2014   Varicella 05/30/2007, 10/26/2008, 01/08/2015    The following portions of the patient's history were reviewed and updated as appropriate: allergies, current medications, past medical history, past social history, past surgical history and problem list.  Objective:  There were no vitals filed for this visit.  Physical Exam Vitals and nursing note reviewed. Exam conducted with a chaperone present Alfonse Alpers, CNA).  Constitutional:      General: She is not in acute distress.    Appearance: Normal appearance. She is normal weight. She is not ill-appearing, toxic-appearing or diaphoretic.  HENT:     Head: Normocephalic and atraumatic.     Mouth/Throat:     Mouth: Mucous membranes are moist.     Pharynx: Oropharynx is clear. No oropharyngeal exudate or posterior oropharyngeal erythema.  Eyes:     General: No scleral icterus.       Right eye: No discharge.        Left eye: No discharge.     Conjunctiva/sclera: Conjunctivae normal.  Pulmonary:     Effort: Pulmonary effort is normal.  Genitourinary:    General: Normal vulva.     Exam position: Lithotomy position.     Pubic Area: No rash or pubic lice.      Tanner stage (genital): 5.     Labia:        Right: No rash or lesion.        Left: No rash or lesion.      Vagina: Normal. No vaginal discharge, erythema, bleeding or lesions.     Cervix: No cervical motion tenderness, discharge, friability, lesion or erythema.     Uterus: Normal.      Adnexa: Right adnexa normal and left adnexa normal.     Rectum: External hemorrhoid (12 o'clock position) present.     Comments: pH = 4 Musculoskeletal:     Cervical back: Neck supple. No rigidity or tenderness.  Lymphadenopathy:     Head:     Right side of head: No preauricular or posterior auricular adenopathy.     Left side of head: No preauricular or posterior auricular adenopathy.     Cervical: No cervical adenopathy.      Right cervical: No superficial or posterior cervical adenopathy.    Left cervical: No superficial or posterior cervical adenopathy.     Upper Body:     Right upper body: No supraclavicular adenopathy.     Left upper body: No supraclavicular adenopathy.  Skin:    General: Skin is warm and dry.     Capillary Refill: Capillary refill takes less than 2 seconds.     Findings: No rash.  Neurological:     General: No focal deficit present.     Mental Status: She is alert and oriented to person, place, and time.  Psychiatric:        Mood and Affect: Mood normal.        Behavior: Behavior normal.    Assessment and Plan:  Traci Petersen is a 33 y.o. female presenting to the [facility] Department for STI screening  Screening examination for venereal disease -     Pregnancy, urine -     WET PREP FOR TRICH, YEAST, CLUE -     Gonococcus culture -     Gonococcus culture -  Chlamydia/Gonorrhea [place] [facility] -     HIV [place] [facility] -     Syphilis Serology,  Lab  Bacterial vaginosis -     metroNIDAZOLE; Take 1 tablet (500 mg total) by mouth 2 (two) times daily for 7 days.   Patient accepted all screenings including oral, vaginal CT/GC and bloodwork for HIV/RPR, and wet prep. Patient meets criteria for HepB screening? No. Ordered? not applicable Patient meets criteria for HepC screening? No. Ordered? not applicable  Treat wet prep per standing order Discussed time line for [facility] results and that patient will be called with positive results and encouraged patient to call if she had not heard in 2 weeks.  Counseled to return or seek care for continued or worsening symptoms Recommended repeat testing in 3 months with positive results. Recommended condom use with all sex for STI prevention.   Patient is currently using  no methods  to prevent pregnancy.    No follow-ups on file.  No future appointments.  Clydene Fake, MD

## 2023-11-22 NOTE — Patient Instructions (Signed)
 STI screening - Today we obtained a vaginal swab to screen for gonorrhea, chlamydia, and trichomonas - We also obtained a blood sample to screen for HIV and syphilis - If the results are abnormal, I will give you a call.    Estimated time frame for results collected at the [facility] Department: Same day Trichomonas Yeast BV (bacterial vaginosis)  Within 1-2 weeks Gonorrhea Chlamydia  Within 2-3 weeks HIV Syphilis Hepatitis B Hepatitis C

## 2023-11-28 LAB — GONOCOCCUS CULTURE

## 2023-12-23 NOTE — Telephone Encounter (Signed)
 pt lft mess on my vmail inquiring about RTW date after her 03/06/24 surgery. I called bk and lft on her vmail to put RTW 05/12/24. I left my fax# for Dr.portion of forms and to adv when she will start her leave. recorder cut off/??

## 2023-12-23 NOTE — Telephone Encounter (Signed)
 called pt back. She will have employer fax me her forms. DOS 03/06/24 and last day at work 03/03/24. I told her RTW 05/12/24 but could revised if she wants to return earlier. I gave her my fax#

## 2023-12-27 DIAGNOSIS — R1031 Right lower quadrant pain: Secondary | ICD-10-CM | POA: Diagnosis not present

## 2023-12-27 DIAGNOSIS — N926 Irregular menstruation, unspecified: Secondary | ICD-10-CM | POA: Diagnosis not present

## 2023-12-27 DIAGNOSIS — R10819 Abdominal tenderness, unspecified site: Secondary | ICD-10-CM | POA: Diagnosis not present

## 2024-01-08 DIAGNOSIS — Z0271 Encounter for disability determination: Secondary | ICD-10-CM

## 2024-01-08 NOTE — Telephone Encounter (Signed)
 lfrt mess on vmail for pt that I have not recd forms from her job. I left my fax# for her to give to them to fax to me. DOS 03/06/24- but was looking for foms she said they woud send

## 2024-01-08 NOTE — Telephone Encounter (Signed)
 Cld pt back to adv fax was short 2 pages.She resent all pages. I faxed completed [employer] form/notes and faxed back to Ms. Echeverry [phone] DOS 03/06/24 OOW 03/03/24-05/12/24

## 2024-01-10 ENCOUNTER — Telehealth: Payer: Self-pay | Admitting: Podiatry

## 2024-01-10 NOTE — Telephone Encounter (Signed)
 DOS: 7/225/25  BIL MALLET TOE REPAIR -[id number]     EFFECTIVE DATE :  08/14/23  DEDUCTIBLE :  $0.00  REMAINING:  $0.00  OOP:   $2,000.00  REMAINING:   $1,815.72  CO INSURANCE:   25%   PER ANNA P OF [employer] NO PRIOR AUTH IS REQ FOR CPT CODE 19147  WGN#[id number]

## 2024-03-03 DIAGNOSIS — E559 Vitamin D deficiency, unspecified: Secondary | ICD-10-CM | POA: Diagnosis not present

## 2024-03-03 DIAGNOSIS — K5900 Constipation, unspecified: Secondary | ICD-10-CM | POA: Diagnosis not present

## 2024-03-03 DIAGNOSIS — Z131 Encounter for screening for diabetes mellitus: Secondary | ICD-10-CM | POA: Diagnosis not present

## 2024-03-03 DIAGNOSIS — E7849 Other hyperlipidemia: Secondary | ICD-10-CM | POA: Diagnosis not present

## 2024-03-04 ENCOUNTER — Telehealth: Payer: Self-pay | Admitting: Podiatry

## 2024-03-04 ENCOUNTER — Other Ambulatory Visit: Payer: Self-pay | Admitting: Podiatry

## 2024-03-04 DIAGNOSIS — Z0271 Encounter for disability determination: Secondary | ICD-10-CM

## 2024-03-04 MED ORDER — CEPHALEXIN 500 MG PO CAPS: 500.0000 mg | ORAL_CAPSULE | Freq: Three times a day (TID) | ORAL | 0 refills | Status: DC

## 2024-03-04 MED ORDER — OXYCODONE-ACETAMINOPHEN 10-325 MG PO TABS: 1.0000 | ORAL_TABLET | Freq: Three times a day (TID) | ORAL | 0 refills | Status: AC | PRN

## 2024-03-04 MED ORDER — ONDANSETRON HCL 4 MG PO TABS: 4.0000 mg | ORAL_TABLET | Freq: Three times a day (TID) | ORAL | 0 refills | Status: DC | PRN

## 2024-03-04 NOTE — Telephone Encounter (Signed)
 Recd [employer] form and emailing the pt the completed form and notes to [email or web address]

## 2024-03-06 DIAGNOSIS — M2041 Other hammer toe(s) (acquired), right foot: Secondary | ICD-10-CM | POA: Diagnosis not present

## 2024-03-06 DIAGNOSIS — M2061 Acquired deformities of toe(s), unspecified, right foot: Secondary | ICD-10-CM | POA: Diagnosis not present

## 2024-03-06 DIAGNOSIS — M2062 Acquired deformities of toe(s), unspecified, left foot: Secondary | ICD-10-CM | POA: Diagnosis not present

## 2024-03-06 DIAGNOSIS — M2042 Other hammer toe(s) (acquired), left foot: Secondary | ICD-10-CM | POA: Diagnosis not present

## 2024-03-11 ENCOUNTER — Ambulatory Visit (INDEPENDENT_AMBULATORY_CARE_PROVIDER_SITE_OTHER): Admitting: Podiatry

## 2024-03-11 DIAGNOSIS — M205X9 Other deformities of toe(s) (acquired), unspecified foot: Secondary | ICD-10-CM

## 2024-03-11 NOTE — Progress Notes (Signed)
 She presents today for her first postop visit date of surgery 03/06/2024 bilateral mallet toe repair second toes.  She denies fever chills nausea mobic  muscle aches pain states they have been doing very well except for when her daughter laid across her feet by accident.  Objective: Vital signs are stable alert oriented 3 Dressel dressings intact most removed demonstrates no erythema no edema salines drainage odor toes are in rectus position.  Assessment: Well-healing surgical toes second bilateral x 1 week.  Plan: Redressed today dressed a compressive dressing demonstrated to her how to redress these if necessary.  I will follow-up with her in 1 to 2 weeks for suture removal.

## 2024-03-22 ENCOUNTER — Other Ambulatory Visit: Payer: Self-pay | Admitting: Podiatry

## 2024-03-25 ENCOUNTER — Ambulatory Visit (INDEPENDENT_AMBULATORY_CARE_PROVIDER_SITE_OTHER): Admitting: Podiatry

## 2024-03-25 DIAGNOSIS — M205X9 Other deformities of toe(s) (acquired), unspecified foot: Secondary | ICD-10-CM

## 2024-03-26 NOTE — Progress Notes (Signed)
 She presents today for her second postop visit date of surgery March 07, 2019 for bilateral DIPJ mallet toe repairs.  She states that they are doing just fine no trouble.  She states that she did stub her toe today but it is feels okay.  I think she is referring to her left toe.  Objective: Vital signs are stable she is alert oriented x 3.  There is no erythema edema salines drainage odor sutures are intact margins well coapted sutures were removed today margins remain well coapted the toe is rectus position.  Assessment: Rectus mallet toe repair second digit bilateral.  Plan: Encouraged her to try to get back into some regular shoe gear but I did encourage her to wrap the distal aspect of the toe with Coban and provided her with sample of that she will follow-up with me in 2 to 3 weeks.

## 2024-03-31 DIAGNOSIS — E559 Vitamin D deficiency, unspecified: Secondary | ICD-10-CM | POA: Diagnosis not present

## 2024-03-31 DIAGNOSIS — E7849 Other hyperlipidemia: Secondary | ICD-10-CM | POA: Diagnosis not present

## 2024-03-31 DIAGNOSIS — Z131 Encounter for screening for diabetes mellitus: Secondary | ICD-10-CM | POA: Diagnosis not present

## 2024-04-01 DIAGNOSIS — L853 Xerosis cutis: Secondary | ICD-10-CM | POA: Diagnosis not present

## 2024-04-01 DIAGNOSIS — L708 Other acne: Secondary | ICD-10-CM | POA: Diagnosis not present

## 2024-04-07 DIAGNOSIS — Z0000 Encounter for general adult medical examination without abnormal findings: Secondary | ICD-10-CM | POA: Diagnosis not present

## 2024-04-07 DIAGNOSIS — K5900 Constipation, unspecified: Secondary | ICD-10-CM | POA: Diagnosis not present

## 2024-04-07 DIAGNOSIS — Z23 Encounter for immunization: Secondary | ICD-10-CM | POA: Diagnosis not present

## 2024-04-07 DIAGNOSIS — E7849 Other hyperlipidemia: Secondary | ICD-10-CM | POA: Diagnosis not present

## 2024-04-07 DIAGNOSIS — E559 Vitamin D deficiency, unspecified: Secondary | ICD-10-CM | POA: Diagnosis not present

## 2024-04-07 DIAGNOSIS — Z1331 Encounter for screening for depression: Secondary | ICD-10-CM | POA: Diagnosis not present

## 2024-04-07 DIAGNOSIS — Z8249 Family history of ischemic heart disease and other diseases of the circulatory system: Secondary | ICD-10-CM | POA: Diagnosis not present

## 2024-04-15 ENCOUNTER — Encounter: Admitting: Podiatry

## 2024-04-22 ENCOUNTER — Ambulatory Visit (INDEPENDENT_AMBULATORY_CARE_PROVIDER_SITE_OTHER): Admitting: Podiatry

## 2024-04-22 DIAGNOSIS — M205X9 Other deformities of toe(s) (acquired), unspecified foot: Secondary | ICD-10-CM

## 2024-04-22 NOTE — Progress Notes (Signed)
 She presents today for her third postop visit date of surgery March 06, 2024 bilateral second mallet toe repairs.  She states that she is doing really well has some strange sensation sometimes particularly with attempted shoewear.  But all in all she states she is doing really well.  She is scheduled to go back to work the first of next month.  Objective: Vital signs are stable alert and oriented x 3.  Pulses are palpable.  Toes are rectus compression Coban is in place.  Once removed minimal edema no erythema cellulitis drainage or odor.  Assessment well-healing surgical toes.  Plan: Encouraged her to try to get into regular shoes over the next couple of weeks and continue to use the compression wrap with massage therapy.  I will follow-up with her in 1 month.  She may come by at the end of this month and pick up a note to go back to work provide if she is able to wear shoes comfortably.

## 2024-05-05 ENCOUNTER — Telehealth: Payer: Self-pay | Admitting: Podiatry

## 2024-05-05 ENCOUNTER — Encounter: Payer: Self-pay | Admitting: Podiatry

## 2024-05-05 NOTE — Telephone Encounter (Signed)
 Patient called stating she wants to go back to work this Monday. She would like a note. Patient is Post Op.

## 2024-05-07 ENCOUNTER — Telehealth: Payer: Self-pay | Admitting: Podiatry

## 2024-05-07 NOTE — Telephone Encounter (Signed)
 Note that RTW note was done for the pt.

## 2024-05-20 ENCOUNTER — Ambulatory Visit (INDEPENDENT_AMBULATORY_CARE_PROVIDER_SITE_OTHER): Admitting: Podiatry

## 2024-05-20 DIAGNOSIS — M205X9 Other deformities of toe(s) (acquired), unspecified foot: Secondary | ICD-10-CM

## 2024-05-20 NOTE — Progress Notes (Signed)
 She presents today for postop visit #4 date of surgery March 06, 2024 bilateral mallet toe repair second digits bilaterally.  She states that as long as Rapydan they feel pretty good.  Objective: Vital signs are stable alert and oriented x 3.  Pulses are palpable.  There is no erythema just mild edema no cellulitis drainage or odor toes are in rectus position.  Assessment: Well-healing surgical foot right and left.  Plan: We wrapped the second toe bilaterally.  Follow-up with her as needed

## 2024-06-30 ENCOUNTER — Telehealth: Payer: Self-pay | Admitting: Podiatry

## 2024-06-30 NOTE — Telephone Encounter (Signed)
 recd [employer] forms and cld pt to adv of 25.00 fee must be pd to complete and to call bk to adv if there are any restric/accomm needed.Also need to vrf email addr on file to send back, no fax# on forms

## 2024-07-01 DIAGNOSIS — Z0279 Encounter for issue of other medical certificate: Secondary | ICD-10-CM

## 2024-07-02 ENCOUNTER — Telehealth: Payer: Self-pay | Admitting: Podiatry

## 2024-07-02 NOTE — Telephone Encounter (Signed)
 cld pt bk and lft another mess need to know details for her [employer] forms- restrictions/accomm. I will email back to her since no fax# on form

## 2024-07-06 ENCOUNTER — Telehealth: Payer: Self-pay | Admitting: Podiatry

## 2024-07-06 NOTE — Telephone Encounter (Signed)
 Pt cld bk and said was already back to work and no restrictions. Emailed to pt

## 2024-07-06 NOTE — Telephone Encounter (Signed)
 pt lft mess ok to email bk to her. I called her again and lft mess I still need to know if she needs accom/restriction.

## 2024-07-22 DIAGNOSIS — L708 Other acne: Secondary | ICD-10-CM | POA: Diagnosis not present

## 2024-07-23 NOTE — Progress Notes (Deleted)
 GYNECOLOGY ANNUAL PHYSICAL EXAM PROGRESS NOTE  Subjective:    Traci Petersen is a 33 y.o. [id number] female who presents for an annual exam.  The patient is sexually active. The patient participates in regular exercise: no. Has the patient ever been transfused or tattooed?: no. The patient reports that there is not domestic violence in her life.   The patient has the following complaints today:   Menstrual History: Menarche age: 62 Patient's last menstrual period was 07/17/2024. Period Cycle (Days): 28 Period Duration (Days): 3-5 Period Pattern: Regular Menstrual Flow: Moderate Menstrual Control: Maxi pad Menstrual Control Change Freq (Hours): 2-3 Dysmenorrhea: (!) Mild Dysmenorrhea Symptoms: Cramping   Gynecologic History:  Contraception: condoms History of STI's: Denies Last Pap: 05/08/2022. Results were: normal. Notes h/o abnormal pap smears. Last mammogram: Not age appropriate   Upstream - 07/28/24 9147       Pregnancy Intention Screening   Does the patient want to become pregnant in the next year? Ok Either Way    Does the patient's partner want to become pregnant in the next year? Yes    Would the patient like to discuss contraceptive options today? No      Contraception Wrap Up   Current Method Female Condom;Pregnant/Seeking Pregnancy    Contraception Counseling Provided No    How was the end contraceptive method provided? N/A            OB History  Gravida Para Term Preterm AB Living  2 2 2  0 0 2  SAB IAB Ectopic Multiple Live Births  0 0 0 0 2    # Outcome Date GA Lbr Len/2nd Weight Sex Type Anes PTL Lv  2 Term 01/06/15 [redacted]w[redacted]d 06:37 / 00:20 6 lb 6.8 oz (2.914 kg) F Vag-Spont EPI  LIV     Apgar1: 7  Apgar5: 8  1 Term 10/20/12   6 lb 5 oz (2.863 kg) F Vag-Spont  N LIV    Past Medical History:  Diagnosis Date   Bunion    Chlamydia    Elevated cholesterol    Vaginal Pap smear, abnormal     Past Surgical History:  Procedure Laterality Date    CORRECTION HAMMER TOE Bilateral 03/10/2024   FOOT SURGERY Bilateral 2004    Family History  Problem Relation Age of Onset   Heart disease Father    Hypertension Father    Alcohol abuse Maternal Grandfather    Breast cancer Paternal Grandmother    Hypertension Paternal Grandmother    Alcohol abuse Paternal Grandfather    Heart murmur Daughter    Down syndrome Daughter     Social History   Socioeconomic History   Marital status: Single    Spouse name: Not on file   Number of children: Not on file   Years of education: Not on file   Highest education level: Not on file  Occupational History   Not on file  Tobacco Use   Smoking status: Never    Passive exposure: Past   Smokeless tobacco: Never   Tobacco comments:    Passive smoke exposure in childhood.  Vaping Use   Vaping status: Never Used  Substance and Sexual Activity   Alcohol use: Yes    Alcohol/week: 1.0 standard drink of alcohol    Types: 1 Glasses of wine per week    Comment: occasionally   Drug use: No   Sexual activity: Yes    Partners: Male    Birth control/protection: Condom  Other Topics Concern  Not on file  Social History Narrative   Not on file   Social Drivers of Health   Tobacco Use: Low Risk  (04/07/2024)   Received from [facility]   Patient History    Smoking Tobacco Use: Never    Smokeless Tobacco Use: Never    Passive Exposure: Not on file  Financial Resource Strain: Low Risk  (04/07/2024)   Received from [facility]   Overall Financial Resource Strain (CARDIA)    Difficulty of Paying Living Expenses: Not very hard  Food Insecurity: No Food Insecurity (04/07/2024)   Received from [facility]   Epic    Within the past 12 months, you worried that your food would run out before you got the money to buy more.: Never true    Within the past 12 months, the food you bought just didn't last and you didn't have money to get more.: Never true   Transportation Needs: No Transportation Needs (04/07/2024)   Received from [facility] - Transportation    In the past 12 months, has lack of transportation kept you from medical appointments or from getting medications?: No    Lack of Transportation (Non-Medical): No  Physical Activity: Not on file  Stress: Not on file  Social Connections: Not on file  Intimate Partner Violence: Not At Risk (07/31/2022)   Humiliation, Afraid, Rape, and Kick questionnaire    Fear of Current or Ex-Partner: No    Emotionally Abused: No    Physically Abused: No    Sexually Abused: No  Depression (PHQ2-9): Low Risk (07/28/2024)   Depression (PHQ2-9)    PHQ-2 Score: 0  Alcohol Screen: Not on file  Housing: Low Risk  (04/07/2024)   Received from [facility]   Epic    In the last 12 months, was there a time when you were not able to pay the mortgage or rent on time?: No    In the past 12 months, how many times have you moved where you were living?: 0    At any time in the past 12 months, were you homeless or living in a shelter (including now)?: No  Utilities: Not At Risk (04/07/2024)   Received from [facility]   Epic    In the past 12 months has the electric, gas, oil, or water company threatened to shut off services in your home?: No  Health Literacy: Not on file    Medications Ordered Prior to Encounter[1]  Allergies[2]   Review of Systems Constitutional: negative for chills, fatigue, fevers and sweats Eyes: negative for irritation, redness and visual disturbance Ears, nose, mouth, throat, and face: negative for hearing loss, nasal congestion, snoring and tinnitus Respiratory: negative for asthma, cough, sputum Cardiovascular: negative for chest pain, dyspnea, exertional chest pressure/discomfort, irregular heart beat, palpitations and syncope Gastrointestinal: negative for abdominal pain, change in bowel habits, nausea and  vomiting Genitourinary: negative for abnormal menstrual periods, genital lesions, sexual problems and vaginal discharge, dysuria and urinary incontinence Integument/breast: negative for breast lump, breast tenderness and nipple discharge Hematologic/lymphatic: negative for bleeding and easy bruising Musculoskeletal:negative for back pain and muscle weakness Neurological: negative for dizziness, headaches, vertigo and weakness Endocrine: negative for diabetic symptoms including polydipsia, polyuria and skin dryness Allergic/Immunologic: negative for hay fever and urticaria      Objective:  Resp. rate 16, height 5' 6 (1.676 m), weight 195 lb (88.5 kg), last menstrual period 07/17/2024. Body  mass index is 31.47 kg/m.    General Appearance:    Alert, cooperative, no distress, appears stated age  Head:    Normocephalic, without obvious abnormality, atraumatic  Eyes:    PERRL, conjunctiva/corneas clear, EOM's intact, both eyes  Ears:    Normal external ear canals, both ears  Nose:   Nares normal, septum midline, mucosa normal, no drainage or sinus tenderness  Throat:   Lips, mucosa, and tongue normal; teeth and gums normal  Neck:   Supple, symmetrical, trachea midline, no adenopathy; thyroid: no enlargement/tenderness/nodules; no carotid bruit or JVD  Back:     Symmetric, no curvature, ROM normal, no CVA tenderness  Lungs:     Clear to auscultation bilaterally, respirations unlabored  Chest Wall:    No tenderness or deformity   Heart:    Regular rate and rhythm, S1 and S2 normal, no murmur, rub or gallop  Breast Exam:    No tenderness, masses, or nipple abnormality  Abdomen:     Soft, non-tender, bowel sounds active all four quadrants, no masses, no organomegaly.    Genitalia:    Pelvic:external genitalia normal, vagina without lesions, discharge, or tenderness, rectovaginal septum  normal. Cervix normal in appearance, no cervical motion tenderness, no adnexal masses or tenderness.  Uterus  normal size, shape, mobile, regular contours, nontender.  Rectal:    Normal external sphincter.  No hemorrhoids appreciated. Internal exam not done.   Extremities:   Extremities normal, atraumatic, no cyanosis or edema  Pulses:   2+ and symmetric all extremities  Skin:   Skin color, texture, turgor normal, no rashes or lesions  Lymph nodes:   Cervical, supraclavicular, and axillary nodes normal  Neurologic:   CNII-XII intact, normal strength, sensation and reflexes throughout   .  Labs:  Lab Results  Component Value Date   WBC 7.6 10/02/2020   HGB 12.9 10/02/2020   HCT 38.0 10/02/2020   MCV 92.2 10/02/2020   PLT 246 10/02/2020    Lab Results  Component Value Date   CREATININE 0.87 10/02/2020   BUN 14 10/02/2020   NA 136 10/02/2020   K 3.8 10/02/2020   CL 105 10/02/2020   CO2 24 10/02/2020    Lab Results  Component Value Date   ALT 12 (L) 07/27/2014   AST 6 (L) 07/27/2014   ALKPHOS 59 07/27/2014   BILITOT 0.2 07/27/2014    No results found for: TSH   Assessment:   1. Encounter for well woman exam with routine gynecological exam   2. Screen for STD (sexually transmitted disease)      Plan:  Blood tests: UTD by PCP. Breast self exam technique reviewed and patient encouraged to perform self-exam monthly. Contraception: {contraceptive methods:5051}. Discussed healthy lifestyle modifications. Mammogram Not age appropriate Pap smear UTD. Flu vaccine: Declined Follow up in 1 year for annual exam   Damien Parsley, CNM  [facility] [place]     [1]  Current Outpatient Medications on File Prior to Visit  Medication Sig Dispense Refill   atorvastatin (LIPITOR) 40 MG tablet Take 40 mg by mouth daily.     rosuvastatin (CRESTOR) 40 MG tablet Take 40 mg by mouth daily.     Current Facility-Administered Medications on File Prior to Visit  Medication Dose Route Frequency Provider Last Rate Last Admin   medroxyPROGESTERone  (DEPO-PROVERA ) injection  150 mg  150 mg Intramuscular Once Waylan Almarie LABOR, CNM      [2]  Allergies Allergen Reactions   Pantoprazole  Hives and Swelling  Also had facial swelling, throat swelling, and hand swelling   Pantoprazole  Sodium Hives, Itching and Swelling

## 2024-07-23 NOTE — Patient Instructions (Signed)
 How to Do a Breast Self-Exam  Doing breast self-exams can help you stay healthy. They're one way to know what's normal for your breasts. They can help you catch a problem while it's still small and can be treated. You need to: Check your breasts often. Tell your doctor about any changes. You should do breast self-exams even if you have breast implants. What you need: A mirror. A well-lit room. A pillow or other soft object. How to do a breast self-exam Look for changes  Take off all the clothes above your waist. Stand in front of a mirror in a room with good lighting. Put your hands down at your sides. Compare your breasts in the mirror. Look for difference between them, such as: Differences in shape. Differences in size. Wrinkles, dips, and bumps in one breast and not the other. Look at each breast for skin changes, such as: Redness. Scaly spots. Spots where your skin is thicker. Dimpling. Open sores. Look for changes in your nipples, such as: Fluid coming out of a nipple. Fluid around a nipple. Bleeding. Dimpling. Redness. A nipple that looks pushed in or that has changed position. Feel for changes Lie on your back. Feel each breast. To do this: Pick a breast to feel. Place a pillow under the shoulder closest to that breast. Put the arm closest to that breast behind your head. Feel the breast using the hand of your other arm. Use the pads of your three middle fingers to make small circles starting near the nipple. Use light, medium, and firm pressure. Keep making circles, moving down over the breast. Stop when you feel your ribs. Start making circles with your fingers again, this time going up until you reach your collarbone. Then, make circles out across your breast and into your armpit area. Squeeze your nipple. Check for fluid and lumps. Do these steps again to check your other breast. Sit or stand in the tub or shower. With soapy water on your skin, feel each  breast the same way you did when you were lying down. Write down what you find Writing down what you find can help you keep track of what you want to tell your doctor. Write down: What's normal for each breast. Any changes you find. Write down: The kind of change. If your breast feels tender or painful. Any lump you find. Write down its size and where it is. When you last had your period.  Preventive Care 20-28 Years Old, Female Preventive care refers to lifestyle choices and visits with your health care provider that can promote health and wellness. Preventive care visits are also called wellness exams. What can I expect for my preventive care visit? Counseling During your preventive care visit, your health care provider may ask about your: Medical history, including: Past medical problems. Family medical history. Pregnancy history. Current health, including: Menstrual cycle. Method of birth control. Emotional well-being. Home life and relationship well-being. Sexual activity and sexual health. Lifestyle, including: Alcohol, nicotine or tobacco, and drug use. Access to firearms. Diet, exercise, and sleep habits. Work and work astronomer. Sunscreen use. Safety issues such as seatbelt and bike helmet use. Physical exam Your health care provider may check your: Height and weight. These may be used to calculate your BMI (body mass index). BMI is a measurement that tells if you are at a healthy weight. Waist circumference. This measures the distance around your waistline. This measurement also tells if you are at a healthy weight and may help  predict your risk of certain diseases, such as type 2 diabetes and high blood pressure. Heart rate and blood pressure. Body temperature. Skin for abnormal spots. What immunizations do I need?  Vaccines are usually given at various ages, according to a schedule. Your health care provider will recommend vaccines for you based on your age,  medical history, and lifestyle or other factors, such as travel or where you work. What tests do I need? Screening Your health care provider may recommend screening tests for certain conditions. This may include: Pelvic exam and Pap test. Lipid and cholesterol levels. Diabetes screening. This is done by checking your blood sugar (glucose) after you have not eaten for a while (fasting). Hepatitis B test. Hepatitis C test. HIV (human immunodeficiency virus) test. STI (sexually transmitted infection) testing, if you are at risk. BRCA-related cancer screening. This may be done if you have a family history of breast, ovarian, tubal, or peritoneal cancers. Talk with your health care provider about your test results, treatment options, and if necessary, the need for more tests. Follow these instructions at home: Eating and drinking  Eat a healthy diet that includes fresh fruits and vegetables, whole grains, lean protein, and low-fat dairy products. Take vitamin and mineral supplements as recommended by your health care provider. Do not drink alcohol if: Your health care provider tells you not to drink. You are pregnant, may be pregnant, or are planning to become pregnant. If you drink alcohol: Limit how much you have to 0-1 drink a day. Know how much alcohol is in your drink. In the [place] one drink equals one 12 oz bottle of beer (355 mL), one 5 oz glass of wine (148 mL), or one 1 oz glass of hard liquor (44 mL). Lifestyle Brush your teeth every morning and night with fluoride toothpaste. Floss one time each day. Exercise for at least 30 minutes 5 or more days each week. Do not use any products that contain nicotine or tobacco. These products include cigarettes, chewing tobacco, and vaping devices, such as e-cigarettes. If you need help quitting, ask your health care provider. Do not use drugs. If you are sexually active, practice safe sex. Use a condom or other form of protection to prevent  STIs. If you do not wish to become pregnant, use a form of birth control. If you plan to become pregnant, see your health care provider for a prepregnancy visit. Find healthy ways to manage stress, such as: Meditation, yoga, or listening to music. Journaling. Talking to a trusted person. Spending time with friends and family. Minimize exposure to UV radiation to reduce your risk of skin cancer. Safety Always wear your seat belt while driving or riding in a vehicle. Do not drive: If you have been drinking alcohol. Do not ride with someone who has been drinking. If you have been using any mind-altering substances or drugs. While texting. When you are tired or distracted. Wear a helmet and other protective equipment during sports activities. If you have firearms in your house, make sure you follow all gun safety procedures. Seek help if you have been physically or sexually abused. What's next? Go to your health care provider once a year for an annual wellness visit. Ask your health care provider how often you should have your eyes and teeth checked. Stay up to date on all vaccines. This information is not intended to replace advice given to you by your health care provider. Make sure you discuss any questions you have with your health  care provider. Document Revised: 01/25/2021 Document Reviewed: 01/25/2021 Elsevier Patient Education  2024 [employer]. If you're breastfeeding, the best time to check your breasts is after you feed your baby or after you use a breast pump. If you get a period, the best time to check your breasts is 5-7 days after your period ends. With time, you'll get more used to doing the self-exam. You'll also start to know if there are changes in your breasts. Contact a doctor if: You see a change in the shape or size of your breasts or nipples. You see a change in the skin of your breast or nipples. You have fluid coming from your nipples that isn't normal. You  find a new lump or thick area. You have breast pain. You have any concerns about your breast health. This information is not intended to replace advice given to you by your health care provider. Make sure you discuss any questions you have with your health care provider.

## 2024-07-28 ENCOUNTER — Other Ambulatory Visit (HOSPITAL_COMMUNITY)
Admission: RE | Admit: 2024-07-28 | Discharge: 2024-07-28 | Disposition: A | Discharge status: Home / Self Care | Source: Ambulatory Visit | Attending: Certified Nurse Midwife | Admitting: Certified Nurse Midwife

## 2024-07-28 ENCOUNTER — Ambulatory Visit: Admitting: Certified Nurse Midwife

## 2024-07-28 ENCOUNTER — Encounter: Payer: Self-pay | Admitting: Certified Nurse Midwife

## 2024-07-28 VITALS — Resp 16 | Ht 66.0 in | Wt 195.0 lb

## 2024-07-28 DIAGNOSIS — Z01419 Encounter for gynecological examination (general) (routine) without abnormal findings: Secondary | ICD-10-CM | POA: Insufficient documentation

## 2024-07-28 DIAGNOSIS — Z113 Encounter for screening for infections with a predominantly sexual mode of transmission: Secondary | ICD-10-CM | POA: Diagnosis not present

## 2024-07-28 DIAGNOSIS — Z01411 Encounter for gynecological examination (general) (routine) with abnormal findings: Secondary | ICD-10-CM | POA: Diagnosis not present

## 2024-07-28 DIAGNOSIS — E282 Polycystic ovarian syndrome: Secondary | ICD-10-CM | POA: Diagnosis not present

## 2024-07-28 MED ORDER — SPIRONOLACTONE 50 MG PO TABS: 50.0000 mg | ORAL_TABLET | Freq: Two times a day (BID) | ORAL | 2 refills | Status: AC

## 2024-07-28 NOTE — Progress Notes (Signed)
 GYNECOLOGY ANNUAL PHYSICAL EXAM PROGRESS NOTE  Subjective:    Traci Petersen is a 33 y.o. G34P2002 female who presents for an annual exam.  The patient is sexually active. The patient participates in regular exercise: no. Has the patient ever been transfused or tattooed?: no. The patient reports that there is not domestic violence in her life.   The patient has the following complaints today:  PCOS - Patient would like to rule out if she has PCOS - Has been experiencing unwanted hair growth - Irregular periods - Bloating  - Reports being off birth control for the last 2 years  Menstrual History: Menarche age: 27 Patient's last menstrual period was 07/17/2024. Period Cycle (Days): 28 Period Duration (Days): 3-5 Period Pattern: Regular Menstrual Flow: Moderate Menstrual Control: Maxi pad Menstrual Control Change Freq (Hours): 2-3 Dysmenorrhea: (!) Mild Dysmenorrhea Symptoms: Cramping   Gynecologic History:  Contraception: none History of STI's:  Last Pap: 2023. Results were: abnormal.  2022  Next Pap: 05/09/2027      Upstream - 07/28/24 0852       Pregnancy Intention Screening   Does the patient want to become pregnant in the next year? Ok Either Way    Does the patient's partner want to become pregnant in the next year? Yes    Would the patient like to discuss contraceptive options today? No      Contraception Wrap Up   Current Method Female Condom;Pregnant/Seeking Pregnancy    Contraception Counseling Provided No    How was the end contraceptive method provided? N/A            OB History  Gravida Para Term Preterm AB Living  2 2 2  0 0 2  SAB IAB Ectopic Multiple Live Births  0 0 0 0 2    # Outcome Date GA Lbr Len/2nd Weight Sex Type Anes PTL Lv  2 Term 01/06/15 [redacted]w[redacted]d 06:37 / 00:20 2914 g F Vag-Spont EPI  LIV     Apgar1: 7  Apgar5: 8  1 Term 10/20/12   2863 g F Vag-Spont  N LIV    Past Medical History:  Diagnosis Date   Bunion    Chlamydia     Elevated cholesterol    Vaginal Pap smear, abnormal     Past Surgical History:  Procedure Laterality Date   CORRECTION HAMMER TOE Bilateral 03/10/2024   FOOT SURGERY Bilateral 2004    Family History  Problem Relation Age of Onset   Heart disease Father    Hypertension Father    Alcohol abuse Maternal Grandfather    Breast cancer Paternal Grandmother    Hypertension Paternal Grandmother    Alcohol abuse Paternal Grandfather    Heart murmur Daughter    Down syndrome Daughter     Social History   Socioeconomic History   Marital status: Single    Spouse name: Not on file   Number of children: Not on file   Years of education: Not on file   Highest education level: Not on file  Occupational History   Not on file  Tobacco Use   Smoking status: Never    Passive exposure: Past   Smokeless tobacco: Never   Tobacco comments:    Passive smoke exposure in childhood.  Vaping Use   Vaping status: Never Used  Substance and Sexual Activity   Alcohol use: Yes    Alcohol/week: 1.0 standard drink of alcohol    Types: 1 Glasses of wine per week  Comment: occasionally   Drug use: No   Sexual activity: Yes    Partners: Male    Birth control/protection: Condom  Other Topics Concern   Not on file  Social History Narrative   Not on file   Social Drivers of Health   Tobacco Use: Low Risk  (04/07/2024)   Received from [facility]   Patient History    Smoking Tobacco Use: Never    Smokeless Tobacco Use: Never    Passive Exposure: Not on file  Financial Resource Strain: Low Risk  (04/07/2024)   Received from [facility]   Overall Financial Resource Strain (CARDIA)    Difficulty of Paying Living Expenses: Not very hard  Food Insecurity: No Food Insecurity (04/07/2024)   Received from [facility]   Epic    Within the past 12 months, you worried that your food would run out before you got the money to buy more.: Never true     Within the past 12 months, the food you bought just didn't last and you didn't have money to get more.: Never true  Transportation Needs: No Transportation Needs (04/07/2024)   Received from [facility] - Transportation    In the past 12 months, has lack of transportation kept you from medical appointments or from getting medications?: No    Lack of Transportation (Non-Medical): No  Physical Activity: Not on file  Stress: Not on file  Social Connections: Not on file  Intimate Partner Violence: Not At Risk (07/31/2022)   Humiliation, Afraid, Rape, and Kick questionnaire    Fear of Current or Ex-Partner: No    Emotionally Abused: No    Physically Abused: No    Sexually Abused: No  Depression (PHQ2-9): Low Risk (07/28/2024)   Depression (PHQ2-9)    PHQ-2 Score: 0  Alcohol Screen: Not on file  Housing: Low Risk  (04/07/2024)   Received from [facility]   Epic    In the last 12 months, was there a time when you were not able to pay the mortgage or rent on time?: No    In the past 12 months, how many times have you moved where you were living?: 0    At any time in the past 12 months, were you homeless or living in a shelter (including now)?: No  Utilities: Not At Risk (04/07/2024)   Received from [facility]   Epic    In the past 12 months has the electric, gas, oil, or water company threatened to shut off services in your home?: No  Health Literacy: Not on file    Medications Ordered Prior to Encounter[1]  Allergies[2]   Review of Systems Constitutional: negative for chills, fatigue, fevers and sweats Eyes: negative for irritation, redness and visual disturbance Ears, nose, mouth, throat, and face: negative for hearing loss, nasal congestion, snoring and tinnitus Respiratory: negative for asthma, cough, sputum Cardiovascular: negative for chest pain, dyspnea, exertional chest pressure/discomfort, irregular heart beat,  palpitations and syncope Gastrointestinal: negative for abdominal pain, change in bowel habits, nausea and vomiting Genitourinary: negative for abnormal menstrual periods, genital lesions, sexual problems and vaginal discharge, dysuria and urinary incontinence Integument/breast: negative for breast lump, breast tenderness and nipple discharge Hematologic/lymphatic: negative for bleeding and easy bruising Musculoskeletal:negative for back pain and muscle weakness Neurological: negative for dizziness, headaches, vertigo and weakness Endocrine: negative for diabetic symptoms including polydipsia, polyuria and skin dryness Allergic/Immunologic: negative for  hay fever and urticaria      Objective:  Resp. rate 16, height 5' 6 (1.676 m), weight 88.5 kg, last menstrual period 07/17/2024. Body mass index is 31.47 kg/m.    General Appearance:    Alert, cooperative, no distress, appears stated age  Head:    Normocephalic, without obvious abnormality, atraumatic  Eyes:    PERRL, conjunctiva/corneas clear, EOM's intact, both eyes  Ears:    Normal external ear canals, both ears  Nose:   Nares normal, septum midline, mucosa normal, no drainage or sinus tenderness  Throat:   Lips, mucosa, and tongue normal; teeth and gums normal  Neck:   Supple, symmetrical, trachea midline, no adenopathy; thyroid: no enlargement/tenderness/nodules; no carotid bruit or JVD  Back:     Symmetric, no curvature, ROM normal, no CVA tenderness  Lungs:     Clear to auscultation bilaterally, respirations unlabored  Chest Wall:    No tenderness or deformity   Heart:    Regular rate and rhythm, S1 and S2 normal, no murmur, rub or gallop     Abdomen:     Soft, non-tender, bowel sounds active all four quadrants, no masses, no organomegaly.    Genitalia:    Pelvic:external genitalia normal, vagina without lesions, discharge, or tenderness, rectovaginal septum  normal.   Rectal:    Normal external sphincter.  No hemorrhoids  appreciated. Internal exam not done.   Extremities:   Extremities normal, atraumatic, no cyanosis or edema  Pulses:   2+ and symmetric all extremities  Skin:   Skin color, texture, turgor normal, no rashes or lesions  Lymph nodes:   Cervical, supraclavicular, and axillary nodes normal  Neurologic:   CNII-XII intact, normal strength, sensation and reflexes throughout   .  Labs:  Lab Results  Component Value Date   WBC 7.6 10/02/2020   HGB 12.9 10/02/2020   HCT 38.0 10/02/2020   MCV 92.2 10/02/2020   PLT 246 10/02/2020    Lab Results  Component Value Date   CREATININE 0.87 10/02/2020   BUN 14 10/02/2020   NA 136 10/02/2020   K 3.8 10/02/2020   CL 105 10/02/2020   CO2 24 10/02/2020    Lab Results  Component Value Date   ALT 12 (L) 07/27/2014   AST 6 (L) 07/27/2014   ALKPHOS 59 07/27/2014   BILITOT 0.2 07/27/2014    No results found for: TSH   Assessment:   1. Encounter for well woman exam with routine gynecological exam   2. Screen for STD (sexually transmitted disease)      Plan:  Blood tests: FSH, LH, Progesterone level, TSH, and testoterone, AMH. Breast self exam technique reviewed and patient encouraged to perform self-exam monthly. Deferred breast exam today. Contraception: none. Discussed healthy lifestyle modifications. Pap smear 2028. Flu vaccine: Follow up in 1 year for annual exam  PCOS - Discussed diagnostic criteria for PCOS, labs and  ultrasound ordered   - Will trial Spironolactone  for management of unwanted hair growth, discussed potential to affect lipids, if cholesterol worsens discontinue  - IUD insertion for period management, reviewed risks and benefits, schedule for future appointment for placement   Camillia Adrien Leita LILLETTE, Student-[facility]      [1]  Current Outpatient Medications on File Prior to Visit  Medication Sig Dispense Refill   atorvastatin (LIPITOR) 40 MG tablet Take 40 mg by mouth daily.      rosuvastatin (CRESTOR) 40 MG tablet Take 40 mg by mouth daily.  Current Facility-Administered Medications on File Prior to Visit  Medication Dose Route Frequency Provider Last Rate Last Admin   medroxyPROGESTERone  (DEPO-PROVERA ) injection 150 mg  150 mg Intramuscular Once Waylan Almarie LABOR, CNM      [2]  Allergies Allergen Reactions   Pantoprazole  Hives and Swelling    Also had facial swelling, throat swelling, and hand swelling   Pantoprazole  Sodium Hives, Itching and Swelling

## 2024-07-28 NOTE — Progress Notes (Deleted)
 GYNECOLOGY ANNUAL PHYSICAL EXAM PROGRESS NOTE  Subjective:    Traci Petersen is a 33 y.o. G51P2002 female who presents for an annual exam.  The patient is sexually active. The patient participates in regular exercise: no. Has the patient ever been transfused or tattooed?: no. The patient reports that there is not domestic violence in her life.   The patient has the following complaints today:   Menstrual History:  Patient's last menstrual period was 07/17/2024. Period Cycle (Days): 28 Period Duration (Days): 3-5 Period Pattern: Regular Menstrual Flow: Moderate Menstrual Control: Maxi pad Menstrual Control Change Freq (Hours): 2-3 Dysmenorrhea: (!) Mild Dysmenorrhea Symptoms: Cramping   Gynecologic History:  Contraception: {method:5051} History of STI's:  Last Pap: ***. Results were: {norm/abn:16337}.  ***Denies/Notes h/o abnormal pap smears. Last mammogram: ***. Results were: {norm/abn:16337}    Upstream - 07/28/24 0852       Pregnancy Intention Screening   Does the patient want to become pregnant in the next year? Ok Either Way    Does the patient's partner want to become pregnant in the next year? Yes    Would the patient like to discuss contraceptive options today? No      Contraception Wrap Up   Current Method Female Condom;Pregnant/Seeking Pregnancy    Contraception Counseling Provided No    How was the end contraceptive method provided? N/A            OB History  Gravida Para Term Preterm AB Living  2 2 2  0 0 2  SAB IAB Ectopic Multiple Live Births  0 0 0 0 2    # Outcome Date GA Lbr Len/2nd Weight Sex Type Anes PTL Lv  2 Term 01/06/15 [redacted]w[redacted]d 06:37 / 00:20 6 lb 6.8 oz (2.914 kg) F Vag-Spont EPI  LIV     Apgar1: 7  Apgar5: 8  1 Term 10/20/12   6 lb 5 oz (2.863 kg) F Vag-Spont  N LIV    Past Medical History:  Diagnosis Date   Bunion    Chlamydia    Elevated cholesterol    Vaginal Pap smear, abnormal     Past Surgical History:  Procedure  Laterality Date   CORRECTION HAMMER TOE Bilateral 03/10/2024   FOOT SURGERY Bilateral 2004    Family History  Problem Relation Age of Onset   Heart disease Father    Hypertension Father    Alcohol abuse Maternal Grandfather    Breast cancer Paternal Grandmother    Hypertension Paternal Grandmother    Alcohol abuse Paternal Grandfather    Heart murmur Daughter    Down syndrome Daughter     Social History   Socioeconomic History   Marital status: Single    Spouse name: Not on file   Number of children: Not on file   Years of education: Not on file   Highest education level: Not on file  Occupational History   Not on file  Tobacco Use   Smoking status: Never    Passive exposure: Past   Smokeless tobacco: Never   Tobacco comments:    Passive smoke exposure in childhood.  Vaping Use   Vaping status: Never Used  Substance and Sexual Activity   Alcohol use: Yes    Alcohol/week: 1.0 standard drink of alcohol    Types: 1 Glasses of wine per week    Comment: occasionally   Drug use: No   Sexual activity: Yes    Partners: Male    Birth control/protection: Condom  Other Topics  Concern   Not on file  Social History Narrative   Not on file   Social Drivers of Health   Tobacco Use: Low Risk  (04/07/2024)   Received from [facility]   Patient History    Smoking Tobacco Use: Never    Smokeless Tobacco Use: Never    Passive Exposure: Not on file  Financial Resource Strain: Low Risk  (04/07/2024)   Received from [facility]   Overall Financial Resource Strain (CARDIA)    Difficulty of Paying Living Expenses: Not very hard  Food Insecurity: No Food Insecurity (04/07/2024)   Received from [facility]   Epic    Within the past 12 months, you worried that your food would run out before you got the money to buy more.: Never true    Within the past 12 months, the food you bought just didn't last and you didn't have money to  get more.: Never true  Transportation Needs: No Transportation Needs (04/07/2024)   Received from [facility] - Transportation    In the past 12 months, has lack of transportation kept you from medical appointments or from getting medications?: No    Lack of Transportation (Non-Medical): No  Physical Activity: Not on file  Stress: Not on file  Social Connections: Not on file  Intimate Partner Violence: Not At Risk (07/31/2022)   Humiliation, Afraid, Rape, and Kick questionnaire    Fear of Current or Ex-Partner: No    Emotionally Abused: No    Physically Abused: No    Sexually Abused: No  Depression (PHQ2-9): Low Risk (07/28/2024)   Depression (PHQ2-9)    PHQ-2 Score: 0  Alcohol Screen: Not on file  Housing: Low Risk  (04/07/2024)   Received from [facility]   Epic    In the last 12 months, was there a time when you were not able to pay the mortgage or rent on time?: No    In the past 12 months, how many times have you moved where you were living?: 0    At any time in the past 12 months, were you homeless or living in a shelter (including now)?: No  Utilities: Not At Risk (04/07/2024)   Received from [facility]   Epic    In the past 12 months has the electric, gas, oil, or water company threatened to shut off services in your home?: No  Health Literacy: Not on file    Medications Ordered Prior to Encounter[1]  Allergies[2]   Review of Systems Constitutional: negative for chills, fatigue, fevers and sweats Eyes: negative for irritation, redness and visual disturbance Ears, nose, mouth, throat, and face: negative for hearing loss, nasal congestion, snoring and tinnitus Respiratory: negative for asthma, cough, sputum Cardiovascular: negative for chest pain, dyspnea, exertional chest pressure/discomfort, irregular heart beat, palpitations and syncope Gastrointestinal: negative for abdominal pain, change in bowel  habits, nausea and vomiting Genitourinary: negative for abnormal menstrual periods, genital lesions, sexual problems and vaginal discharge, dysuria and urinary incontinence Integument/breast: negative for breast lump, breast tenderness and nipple discharge Hematologic/lymphatic: negative for bleeding and easy bruising Musculoskeletal:negative for back pain and muscle weakness Neurological: negative for dizziness, headaches, vertigo and weakness Endocrine: negative for diabetic symptoms including polydipsia, polyuria and skin dryness Allergic/Immunologic: negative for hay fever and urticaria      Objective:  Resp. rate 16, height 5' 6 (1.676 m), weight 195 lb (88.5 kg), last menstrual  period 07/17/2024. Body mass index is 31.47 kg/m.    General Appearance:    Alert, cooperative, no distress, appears stated age  Head:    Normocephalic, without obvious abnormality, atraumatic  Eyes:    PERRL, conjunctiva/corneas clear, EOM's intact, both eyes  Ears:    Normal external ear canals, both ears  Nose:   Nares normal, septum midline, mucosa normal, no drainage or sinus tenderness  Throat:   Lips, mucosa, and tongue normal; teeth and gums normal  Neck:   Supple, symmetrical, trachea midline, no adenopathy; thyroid: no enlargement/tenderness/nodules; no carotid bruit or JVD  Back:     Symmetric, no curvature, ROM normal, no CVA tenderness  Lungs:     Clear to auscultation bilaterally, respirations unlabored  Chest Wall:    No tenderness or deformity   Heart:    Regular rate and rhythm, S1 and S2 normal, no murmur, rub or gallop  Breast Exam:    No tenderness, masses, or nipple abnormality  Abdomen:     Soft, non-tender, bowel sounds active all four quadrants, no masses, no organomegaly.    Genitalia:    Pelvic:external genitalia normal, vagina without lesions, discharge, or tenderness, rectovaginal septum  normal. Cervix normal in appearance, no cervical motion tenderness, no adnexal masses or  tenderness.  Uterus normal size, shape, mobile, regular contours, nontender.  Rectal:    Normal external sphincter.  No hemorrhoids appreciated. Internal exam not done.   Extremities:   Extremities normal, atraumatic, no cyanosis or edema  Pulses:   2+ and symmetric all extremities  Skin:   Skin color, texture, turgor normal, no rashes or lesions  Lymph nodes:   Cervical, supraclavicular, and axillary nodes normal  Neurologic:   CNII-XII intact, normal strength, sensation and reflexes throughout   .  Labs:  Lab Results  Component Value Date   WBC 7.6 10/02/2020   HGB 12.9 10/02/2020   HCT 38.0 10/02/2020   MCV 92.2 10/02/2020   PLT 246 10/02/2020    Lab Results  Component Value Date   CREATININE 0.87 10/02/2020   BUN 14 10/02/2020   NA 136 10/02/2020   K 3.8 10/02/2020   CL 105 10/02/2020   CO2 24 10/02/2020    Lab Results  Component Value Date   ALT 12 (L) 07/27/2014   AST 6 (L) 07/27/2014   ALKPHOS 59 07/27/2014   BILITOT 0.2 07/27/2014    No results found for: TSH   Assessment:   1. Encounter for well woman exam with routine gynecological exam   2. Screen for STD (sexually transmitted disease)      Plan:  Blood tests: {blood tests:13147}. Breast self exam technique reviewed and patient encouraged to perform self-exam monthly. Contraception: {contraceptive methods:5051}. Discussed healthy lifestyle modifications. Mammogram {discussed/ordered:14545} Pap smear {discussed/ordered:14545}. Flu vaccine: Follow up in 1 year for annual exam   Lorraine Terriquez, Damien, [place] [facility]      [1]  Current Outpatient Medications on File Prior to Visit  Medication Sig Dispense Refill   atorvastatin (LIPITOR) 40 MG tablet Take 40 mg by mouth daily.     rosuvastatin (CRESTOR) 40 MG tablet Take 40 mg by mouth daily.     Current Facility-Administered Medications on File Prior to Visit  Medication Dose Route Frequency Provider Last Rate Last Admin    medroxyPROGESTERone  (DEPO-PROVERA ) injection 150 mg  150 mg Intramuscular Once Waylan Almarie LABOR, CNM      [2]  Allergies Allergen Reactions   Pantoprazole  Hives and Swelling  Also had facial swelling, throat swelling, and hand swelling   Pantoprazole  Sodium Hives, Itching and Swelling

## 2024-07-29 LAB — CERVICOVAGINAL ANCILLARY ONLY
Bacterial Vaginitis (gardnerella): POSITIVE — AB
Candida Glabrata: NEGATIVE
Candida Vaginitis: POSITIVE — AB
Chlamydia: NEGATIVE
Comment: NEGATIVE
Comment: NEGATIVE
Comment: NEGATIVE
Comment: NEGATIVE
Comment: NEGATIVE
Comment: NORMAL
Neisseria Gonorrhea: NEGATIVE
Trichomonas: NEGATIVE

## 2024-07-30 ENCOUNTER — Other Ambulatory Visit: Payer: Self-pay | Admitting: Certified Nurse Midwife

## 2024-07-30 MED ORDER — FLUCONAZOLE 150 MG PO TABS: 150.0000 mg | ORAL_TABLET | Freq: Once | ORAL | 0 refills | Status: AC

## 2024-08-01 LAB — ANTI MULLERIAN HORMONE: ANTI-MULLERIAN HORMONE (AMH): 1.13 ng/mL

## 2024-08-01 LAB — HIV ANTIBODY (ROUTINE TESTING W REFLEX): HIV Screen 4th Generation wRfx: NONREACTIVE

## 2024-08-01 LAB — PROLACTIN: Prolactin: 13.6 ng/mL (ref 4.8–33.4)

## 2024-08-01 LAB — HSV 1 AND 2 AB, IGG
HSV 1 Glycoprotein G Ab, IgG: NONREACTIVE
HSV 2 IgG, Type Spec: REACTIVE — AB

## 2024-08-01 LAB — TSH: TSH: 1.12 u[IU]/mL (ref 0.450–4.500)

## 2024-08-01 LAB — HEPATITIS B SURFACE ANTIGEN: Hepatitis B Surface Ag: NEGATIVE

## 2024-08-01 LAB — SYPHILIS: RPR W/REFLEX TO RPR TITER AND TREPONEMAL ANTIBODIES, TRADITIONAL SCREENING AND DIAGNOSIS ALGORITHM: RPR Ser Ql: NONREACTIVE

## 2024-08-01 LAB — TESTOSTERONE,FREE AND TOTAL
Testosterone, Free: 3.2 pg/mL (ref 0.0–4.2)
Testosterone: 32 ng/dL (ref 8–60)

## 2024-08-01 LAB — FSH/LH
FSH: 6.3 m[IU]/mL
LH: 13.8 m[IU]/mL

## 2024-08-01 LAB — HEPATITIS C ANTIBODY: Hep C Virus Ab: NONREACTIVE

## 2024-08-04 ENCOUNTER — Encounter: Payer: Self-pay | Admitting: Certified Nurse Midwife

## 2024-08-04 DIAGNOSIS — B009 Herpesviral infection, unspecified: Secondary | ICD-10-CM | POA: Insufficient documentation

## 2024-08-19 ENCOUNTER — Ambulatory Visit (INDEPENDENT_AMBULATORY_CARE_PROVIDER_SITE_OTHER): Payer: Self-pay

## 2024-08-19 DIAGNOSIS — E282 Polycystic ovarian syndrome: Secondary | ICD-10-CM

## 2024-09-16 ENCOUNTER — Telehealth: Payer: Self-pay | Admitting: Family Medicine

## 2024-09-16 ENCOUNTER — Ambulatory Visit: Payer: Self-pay | Admitting: Family Medicine

## 2024-09-16 VITALS — BP 119/79 | HR 79 | Ht 66.0 in | Wt 190.0 lb

## 2024-09-16 DIAGNOSIS — E782 Mixed hyperlipidemia: Secondary | ICD-10-CM

## 2024-09-16 DIAGNOSIS — R87618 Other abnormal cytological findings on specimens from cervix uteri: Secondary | ICD-10-CM | POA: Diagnosis not present

## 2024-09-16 DIAGNOSIS — E7800 Pure hypercholesterolemia, unspecified: Secondary | ICD-10-CM | POA: Diagnosis not present

## 2024-09-16 NOTE — Assessment & Plan Note (Signed)
 Following with GYN.  Last assessed 12-25.

## 2024-09-16 NOTE — Progress Notes (Signed)
 "  New Patient Office Visit  Subjective    Patient ID: Traci Petersen, female    DOB: 06-16-1991  Age: 34 y.o. MRN: [id number]  CC:  Chief Complaint  Patient presents with   Establish Care    HPI Discussed the use of AI scribe software for clinical note transcription with the patient, who gave verbal consent to proceed.  History of Present Illness   Traci Petersen is a 34 year old female with hyperlipidemia who presents for management of her cholesterol levels.  She has a history of hyperlipidemia with cholesterol levels previously recorded as high as 500 mg/dL. She has been on Crestor for approximately two to three years and was also started on Lipitor about a year and a half ago. Her cholesterol levels improved initially but remained above normal. She has not taken her medication today. She has made dietary changes, including not drinking soda and using an air fryer.  Her previous provider did discuss that shot for her cholesterol.  She has a history of foot surgeries, including a mallet repair of the second toe on both feet in July of last year. She also underwent bunion and arch repair surgeries at ages 8 and [age].  Her family history is significant for her father, who had high cholesterol and died of a heart attack at age 65. There is no known diabetes in her father, and he was a smoker. Her mother is reportedly healthy and does not take any medications. Her paternal grandmother had breast cancer.  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 She does not smoke, uses alcohol socially with her last drink being at Christmas, and denies any drug use. She has two children, with her oldest having Down syndrome. No complications during her pregnancies.  She has an abnormal Pap smear and is under the care of a gynecologist, with her last visit in December.      Outpatient Encounter Medications as of 09/16/2024  Medication Sig   atorvastatin  (LIPITOR) 40 MG tablet Take 40 mg by mouth daily.   rosuvastatin (CRESTOR) 40 MG tablet Take 40 mg by mouth daily.   tretinoin (RETIN-A) 0.025 % cream Apply topically.   spironolactone  (ALDACTONE ) 50 MG tablet Take 1 tablet (50 mg total) by mouth 2 (two) times daily. Will start with 50 mg twice daily, and increase to 100 mg twice daily as needed (Patient not taking: Reported on 09/16/2024)   Facility-Administered Encounter Medications as of 09/16/2024  Medication   medroxyPROGESTERone  (DEPO-PROVERA ) injection 150 mg    Past Medical History:  Diagnosis Date   Bunion    Chlamydia    Elevated cholesterol    Vaginal Pap smear, abnormal     Past Surgical History:  Procedure Laterality Date   CORRECTION HAMMER TOE Bilateral 03/10/2024   FOOT SURGERY Bilateral 2004    Family History  Problem Relation Age of Onset   Heart disease Father    Hypertension Father    Alcohol abuse Maternal Grandfather    Breast cancer Paternal Grandmother    Hypertension Paternal Grandmother    Alcohol abuse Paternal Grandfather    Heart murmur Daughter    Down syndrome Daughter     Social History   Socioeconomic History   Marital status: Single    Spouse name: Not on file   Number of children: Not on file   Years of education: Not on file   Highest education level: Not on file  Occupational History  Not on file  Tobacco Use   Smoking status: Never    Passive exposure: Past   Smokeless tobacco: Never   Tobacco comments:    Passive smoke exposure in childhood.  Vaping Use   Vaping status: Never Used  Substance and Sexual Activity   Alcohol use: Yes    Alcohol/week: 1.0 standard drink of alcohol    Types: 1 Glasses of wine per week    Comment: occasionally   Drug use: No   Sexual activity: Yes    Partners: Male    Birth control/protection: Condom  Other Topics Concern   Not on file  Social History Narrative   Not on file   Social Drivers of Health   Tobacco Use: Low Risk   (04/07/2024)   Received from [facility]   Patient History    Smoking Tobacco Use: Never    Smokeless Tobacco Use: Never    Passive Exposure: Not on file  Financial Resource Strain: Low Risk  (04/07/2024)   Received from [facility]   Overall Financial Resource Strain (CARDIA)    Difficulty of Paying Living Expenses: Not very hard  Food Insecurity: No Food Insecurity (04/07/2024)   Received from [facility]   Epic    Within the past 12 months, you worried that your food would run out before you got the money to buy more.: Never true    Within the past 12 months, the food you bought just didn't last and you didn't have money to get more.: Never true  Transportation Needs: No Transportation Needs (04/07/2024)   Received from [facility] - Transportation    In the past 12 months, has lack of transportation kept you from medical appointments or from getting medications?: No    Lack of Transportation (Non-Medical): No  Physical Activity: Not on file  Stress: Not on file  Social Connections: Not on file  Intimate Partner Violence: Not At Risk (07/31/2022)   Humiliation, Afraid, Rape, and Kick questionnaire    Fear of Current or Ex-Partner: No    Emotionally Abused: No    Physically Abused: No    Sexually Abused: No  Depression (PHQ2-9): Low Risk (07/28/2024)   Depression (PHQ2-9)    PHQ-2 Score: 0  Alcohol Screen: Not on file  Housing: Low Risk  (04/07/2024)   Received from [facility]   Epic    In the last 12 months, was there a time when you were not able to pay the mortgage or rent on time?: No    In the past 12 months, how many times have you moved where you were living?: 0    At any time in the past 12 months, were you homeless or living in a shelter (including now)?: No  Utilities: Not At Risk (04/07/2024)   Received from [facility]   Epic    In the past 12 months  has the electric, gas, oil, or water company threatened to shut off services in your home?: No  Health Literacy: Not on file       Objective   BP 119/79   Pulse 79   Ht 5' 6 (1.676 m)   Wt 190 lb (86.2 kg)   LMP 09/02/2024 (Approximate)   SpO2 97%   BMI 30.67 kg/m    Physical Exam Vitals and nursing note reviewed.  Constitutional:      Appearance: Normal appearance.  HENT:  Head: Normocephalic and atraumatic.  Eyes:     Conjunctiva/sclera: Conjunctivae normal.  Cardiovascular:     Rate and Rhythm: Normal rate and regular rhythm.  Pulmonary:     Effort: Pulmonary effort is normal.     Breath sounds: Normal breath sounds.  Musculoskeletal:     Right lower leg: No edema.     Left lower leg: No edema.  Skin:    General: Skin is warm and dry.  Neurological:     Mental Status: She is alert and oriented to person, place, and time.  Psychiatric:        Mood and Affect: Mood normal.        Behavior: Behavior normal.        Thought Content: Thought content normal.        Judgment: Judgment normal.            The ASCVD Risk score (Arnett DK, et al., 2019) failed to calculate for the following reasons:   The 2019 ASCVD risk score is only valid for ages 53 to 65   According to ACC/AHA guidelines, patients with an LDL-C level greater than 190 mg/dL (5.08 mmol/L) should be considered for high-intensity statin therapy. This patient's most recent LDL-C level is 295 mg/dL.     Assessment & Plan:  Mixed hyperlipidemia -     CMP14+EGFR -     Lipid panel  Hypercholesterolemia Assessment & Plan: She reports that she is taking atorvastatin 40 and rosuvastatin 40 mg.  Please stop the atorvastatin.  Fasting lipid and CMP today.  Discussed using an injection like PCSK-9, Repatha, to get her cholesterol under control if needed.  Father died young of CAD.   Other abnormal cytological finding of specimen from cervix Assessment & Plan: Following with GYN.  Last assessed  12-25.     Return in about 6 months (around 03/16/2025).   Armondo Cech K Tianna Baus, MD  "

## 2024-09-16 NOTE — Telephone Encounter (Signed)
 Medical Records release faxed to [phone]

## 2024-09-16 NOTE — Assessment & Plan Note (Addendum)
 She reports that she is taking atorvastatin 40 and rosuvastatin 40 mg.  Please stop the atorvastatin.  Fasting lipid and CMP today.  Discussed using an injection like PCSK-9, Repatha, to get her cholesterol under control if needed.  Father died young of CAD.

## 2024-09-17 ENCOUNTER — Ambulatory Visit: Payer: Self-pay | Admitting: Family Medicine

## 2024-09-17 LAB — CMP14+EGFR
ALT: 18 [IU]/L (ref 0–32)
AST: 16 [IU]/L (ref 0–40)
Albumin: 4.7 g/dL (ref 3.9–4.9)
Alkaline Phosphatase: 80 [IU]/L (ref 41–116)
BUN/Creatinine Ratio: 21 (ref 9–23)
BUN: 17 mg/dL (ref 6–20)
Bilirubin Total: 0.4 mg/dL (ref 0.0–1.2)
CO2: 21 mmol/L (ref 20–29)
Calcium: 10 mg/dL (ref 8.7–10.2)
Chloride: 104 mmol/L (ref 96–106)
Creatinine, Ser: 0.81 mg/dL (ref 0.57–1.00)
Globulin, Total: 2.6 g/dL (ref 1.5–4.5)
Glucose: 88 mg/dL (ref 70–99)
Potassium: 4.4 mmol/L (ref 3.5–5.2)
Sodium: 141 mmol/L (ref 134–144)
Total Protein: 7.3 g/dL (ref 6.0–8.5)
eGFR: 98 mL/min/{1.73_m2}

## 2024-09-17 LAB — LIPID PANEL
Chol/HDL Ratio: 7.5 ratio — ABNORMAL HIGH (ref 0.0–4.4)
Cholesterol, Total: 377 mg/dL — ABNORMAL HIGH (ref 100–199)
HDL: 50 mg/dL
LDL Chol Calc (NIH): 309 mg/dL — ABNORMAL HIGH (ref 0–99)
Triglycerides: 105 mg/dL (ref 0–149)
VLDL Cholesterol Cal: 18 mg/dL (ref 5–40)

## 2025-03-16 ENCOUNTER — Ambulatory Visit: Admitting: Family Medicine
